# Patient Record
Sex: Female | Born: 1970 | Race: White | Hispanic: No | Marital: Married | Smoking: Never smoker
Health system: Southern US, Community
[De-identification: ages and names within clinical notes are randomized; demographics above are authoritative.]

## PROBLEM LIST (undated history)

## (undated) DIAGNOSIS — F32A Depression, unspecified: Secondary | ICD-10-CM

## (undated) DIAGNOSIS — E559 Vitamin D deficiency, unspecified: Secondary | ICD-10-CM

## (undated) DIAGNOSIS — E039 Hypothyroidism, unspecified: Secondary | ICD-10-CM

## (undated) DIAGNOSIS — F329 Major depressive disorder, single episode, unspecified: Secondary | ICD-10-CM

## (undated) DIAGNOSIS — R609 Edema, unspecified: Secondary | ICD-10-CM

## (undated) DIAGNOSIS — E785 Hyperlipidemia, unspecified: Secondary | ICD-10-CM

## (undated) DIAGNOSIS — F419 Anxiety disorder, unspecified: Secondary | ICD-10-CM

## (undated) DIAGNOSIS — D649 Anemia, unspecified: Secondary | ICD-10-CM

## (undated) DIAGNOSIS — E079 Disorder of thyroid, unspecified: Secondary | ICD-10-CM

## (undated) DIAGNOSIS — G4733 Obstructive sleep apnea (adult) (pediatric): Secondary | ICD-10-CM

## (undated) DIAGNOSIS — Z9989 Dependence on other enabling machines and devices: Secondary | ICD-10-CM

## (undated) HISTORY — DX: Edema, unspecified: R60.9

## (undated) HISTORY — DX: Obstructive sleep apnea (adult) (pediatric): G47.33

## (undated) HISTORY — DX: Major depressive disorder, single episode, unspecified: F32.9

## (undated) HISTORY — DX: Anxiety disorder, unspecified: F41.9

## (undated) HISTORY — DX: Hypothyroidism, unspecified: E03.9

## (undated) HISTORY — DX: Hyperlipidemia, unspecified: E78.5

## (undated) HISTORY — DX: Vitamin D deficiency, unspecified: E55.9

## (undated) HISTORY — DX: Dependence on other enabling machines and devices: Z99.89

## (undated) HISTORY — DX: Anemia, unspecified: D64.9

## (undated) HISTORY — PX: WISDOM TOOTH EXTRACTION: SHX21

## (undated) HISTORY — PX: MOUTH SURGERY: SHX715

## (undated) HISTORY — DX: Disorder of thyroid, unspecified: E07.9

## (undated) HISTORY — DX: Depression, unspecified: F32.A

---

## 1999-05-19 ENCOUNTER — Other Ambulatory Visit: Admission: RE | Admit: 1999-05-19 | Discharge: 1999-05-19 | Payer: Self-pay | Admitting: Obstetrics and Gynecology

## 1999-12-06 ENCOUNTER — Emergency Department (HOSPITAL_COMMUNITY): Admission: EM | Admit: 1999-12-06 | Discharge: 1999-12-06 | Payer: Self-pay

## 2000-06-07 ENCOUNTER — Other Ambulatory Visit: Admission: RE | Admit: 2000-06-07 | Discharge: 2000-06-07 | Payer: Self-pay | Admitting: Obstetrics and Gynecology

## 2000-07-14 ENCOUNTER — Encounter: Payer: Self-pay | Admitting: Obstetrics and Gynecology

## 2000-07-14 ENCOUNTER — Ambulatory Visit (HOSPITAL_COMMUNITY): Admission: RE | Admit: 2000-07-14 | Discharge: 2000-07-14 | Payer: Self-pay | Admitting: Obstetrics and Gynecology

## 2000-07-29 ENCOUNTER — Ambulatory Visit (HOSPITAL_COMMUNITY): Admission: RE | Admit: 2000-07-29 | Discharge: 2000-07-29 | Payer: Self-pay | Admitting: *Deleted

## 2000-07-29 ENCOUNTER — Encounter: Payer: Self-pay | Admitting: *Deleted

## 2000-12-15 ENCOUNTER — Inpatient Hospital Stay (HOSPITAL_COMMUNITY): Admission: AD | Admit: 2000-12-15 | Discharge: 2000-12-15 | Payer: Self-pay | Admitting: Obstetrics and Gynecology

## 2000-12-22 ENCOUNTER — Inpatient Hospital Stay (HOSPITAL_COMMUNITY): Admission: AD | Admit: 2000-12-22 | Discharge: 2000-12-24 | Payer: Self-pay | Admitting: Obstetrics and Gynecology

## 2001-06-15 ENCOUNTER — Other Ambulatory Visit: Admission: RE | Admit: 2001-06-15 | Discharge: 2001-06-15 | Payer: Self-pay | Admitting: Obstetrics and Gynecology

## 2002-06-21 ENCOUNTER — Other Ambulatory Visit: Admission: RE | Admit: 2002-06-21 | Discharge: 2002-06-21 | Payer: Self-pay | Admitting: Obstetrics and Gynecology

## 2003-06-13 ENCOUNTER — Encounter: Payer: Self-pay | Admitting: Obstetrics and Gynecology

## 2003-06-13 ENCOUNTER — Ambulatory Visit (HOSPITAL_COMMUNITY): Admission: RE | Admit: 2003-06-13 | Discharge: 2003-06-13 | Payer: Self-pay | Admitting: Obstetrics and Gynecology

## 2003-09-03 ENCOUNTER — Encounter: Admission: RE | Admit: 2003-09-03 | Discharge: 2003-09-03 | Payer: Self-pay | Admitting: Obstetrics and Gynecology

## 2003-09-17 ENCOUNTER — Ambulatory Visit (HOSPITAL_COMMUNITY): Admission: RE | Admit: 2003-09-17 | Discharge: 2003-09-17 | Payer: Self-pay | Admitting: Obstetrics and Gynecology

## 2003-09-19 ENCOUNTER — Inpatient Hospital Stay (HOSPITAL_COMMUNITY): Admission: AD | Admit: 2003-09-19 | Discharge: 2003-09-21 | Payer: Self-pay | Admitting: Obstetrics and Gynecology

## 2004-04-12 IMAGING — US US OB FOLLOW-UP
1 series · 13 of 28 positions shown · non-contrast
Comparison: none

CLINICAL DATA: 36 week 4 day assigned gestational age by prior ultrasound.  Measuring large for dates.  Gestational diabetes.  Evaluate fetal weight and amniotic fluid.

[Series 1: unknown · 0.30mm/px · 13 of 31 slices shown]
[im 2/31]
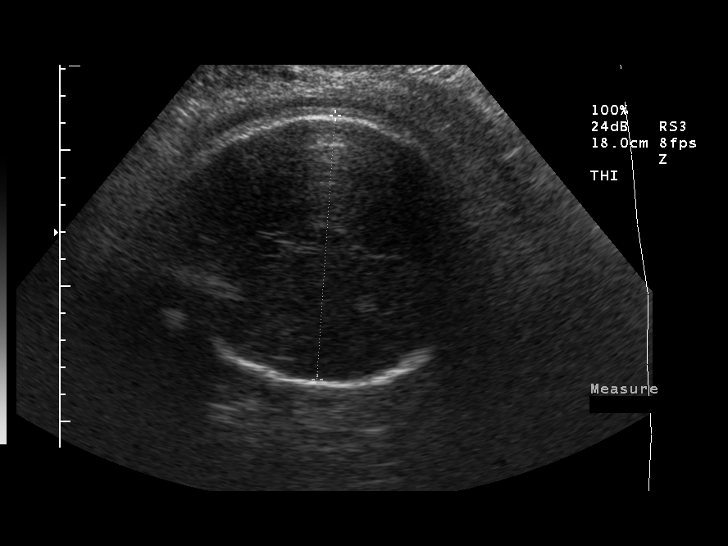
[im 4/31]
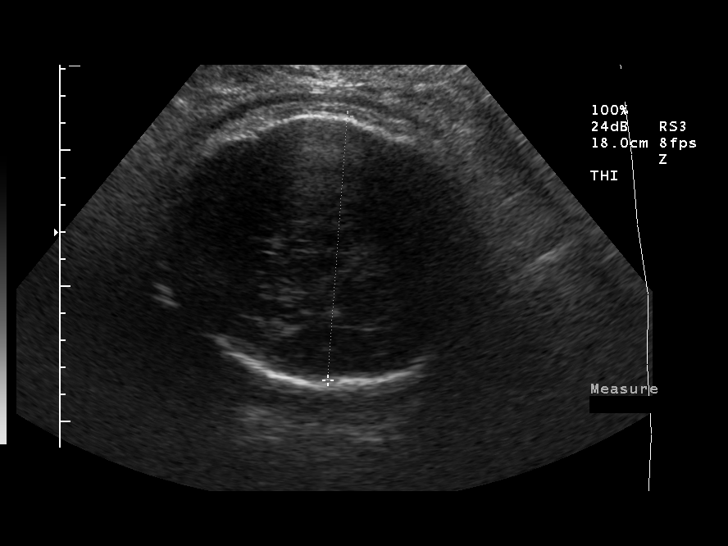
[im 6/31]
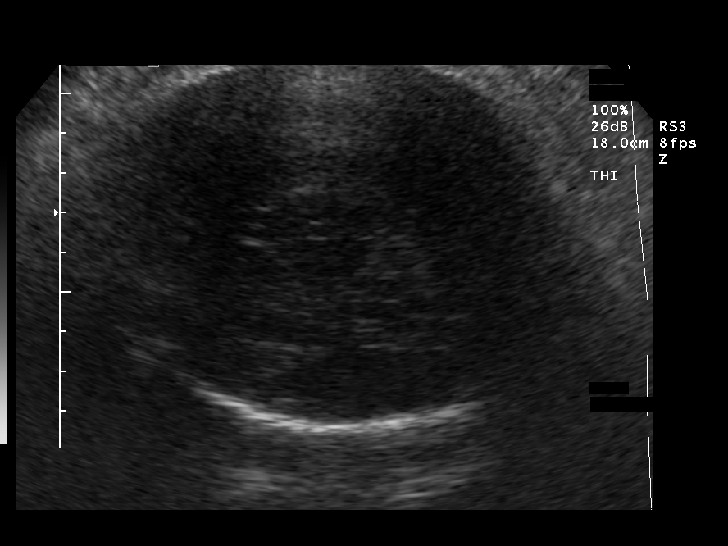
[im 8/31]
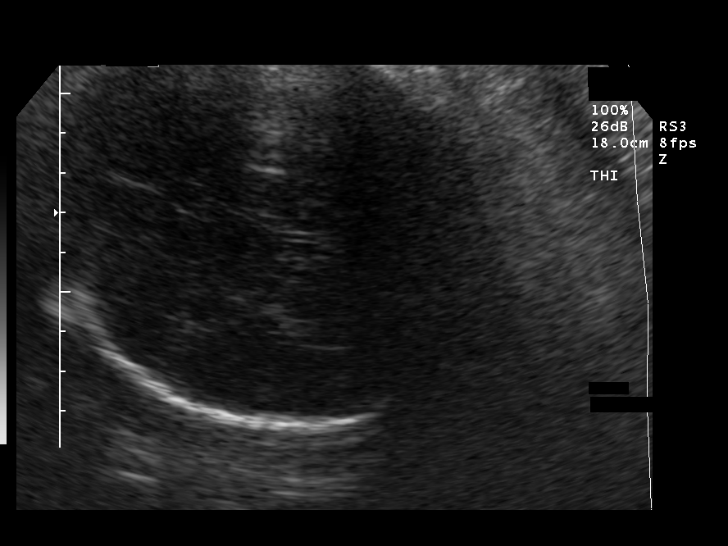
[im 11/31]
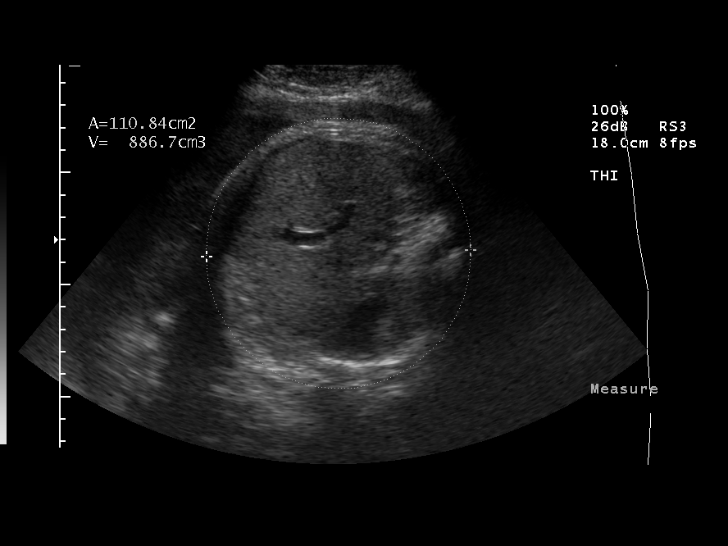
[im 13/31]
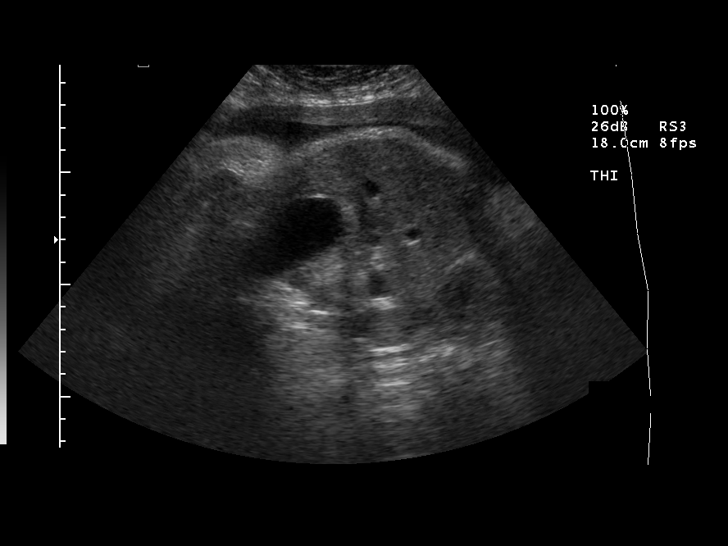
[im 16/31]
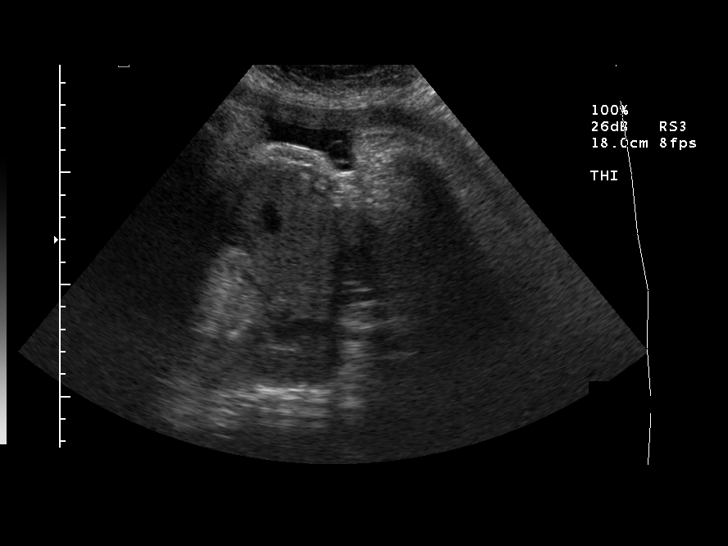
[im 18/31]
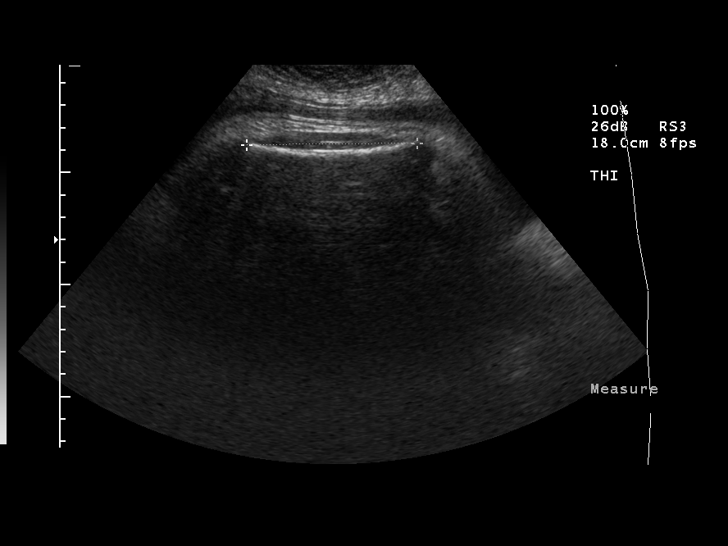
[im 21/31]
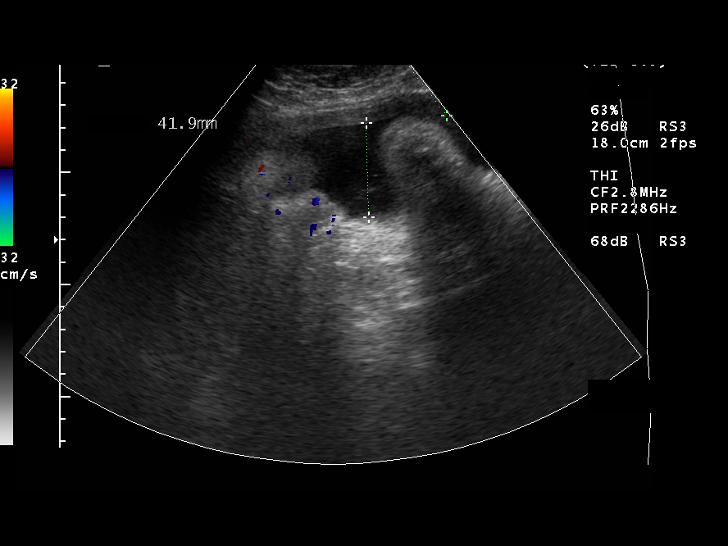
[im 23/31]
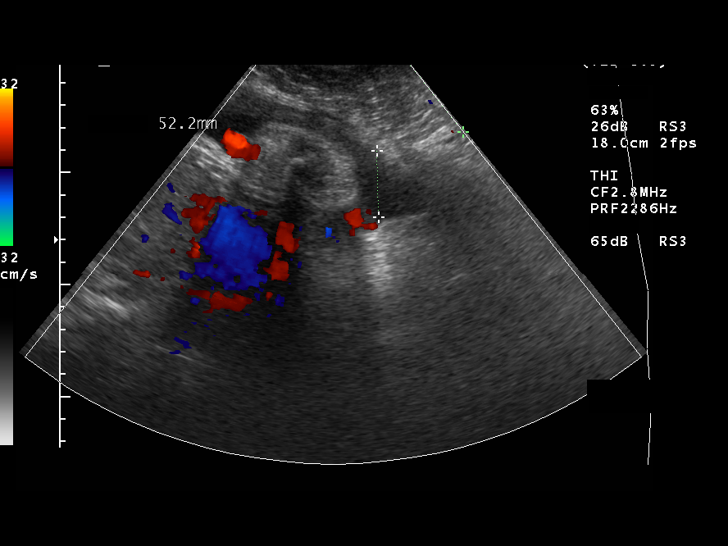
[im 25/31]
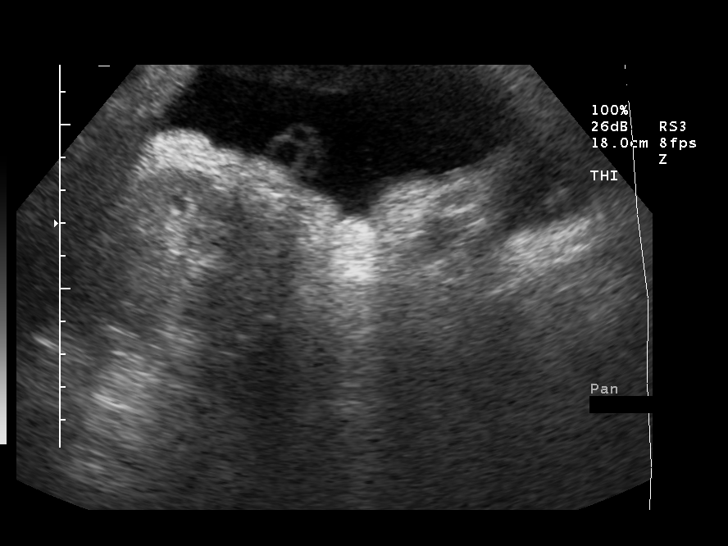
[im 27/31]
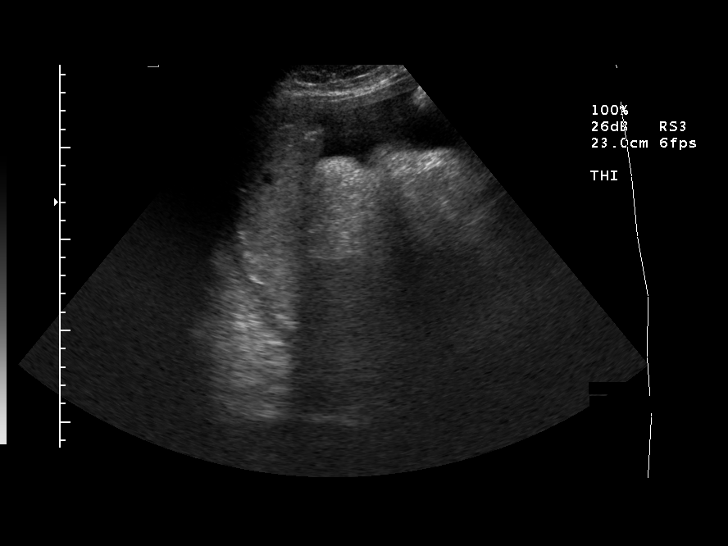
[im 29/31]
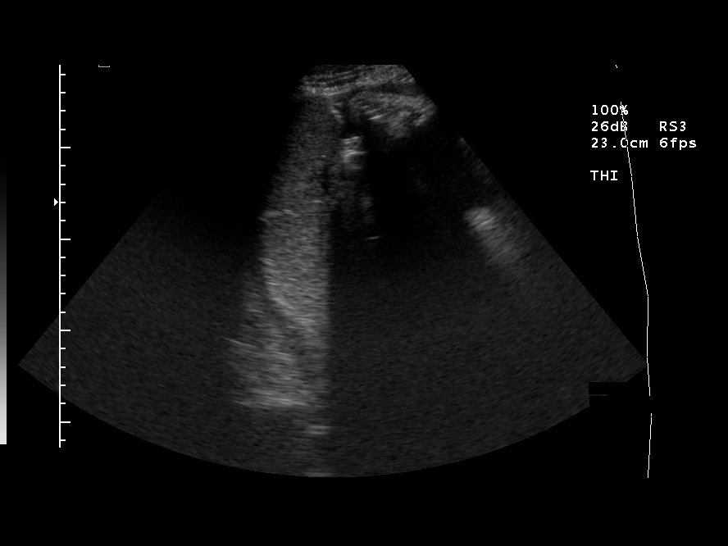

[13 of 28 positions shown; findings below may reference images not displayed]

OBSTETRICAL ULTRASOUND RE-EVALUATION

NUMBER OF FETUSES:  1
HEART RATE:  165
MOVEMENT:  Yes
BREATHING:  No
PRESENTATION:  Cephalic
PLACENTAL LOCATION:  Posterior
GRADE:  II
PREVIA:  No
AMNIOTIC FLUID (subjective):  Normal
AMNIOTIC FLUID (objective):  16.7 cm AFI (5th - 95th%ile =   7.5 ? 24.4 cm for 37 wks)

FETAL BIOMETRY
BPD:  9.8 cm     40 w 0 d
HC:  34.7 cm   40 w 2 d
AC:  37.5 cm   41 w 3 d
FL:  7.5 cm  38 w 1 d
MEAN GA:  40 w 0 d
ASSIGNED GA:  36 w 4 d
EFW:  7984 + / - 605 g (H)       > 95th%ile (95th = 3776 g) for 37 wks

FETAL ANATOMY
LATERAL VENTRICLES:  Visualized 
THALAMI/CSP:  Visualized 
POSTERIOR FOSSA:  Previously seen 
NUCHAL REGION:  Previously seen 
SPINE:  Previously seen 
4 CHAMBER HEART ON LEFT:  Previously seen 
STOMACH ON LEFT:  Visualized 
3 VESSEL CORD:  Visualized 
CORD INSERTION SITE:  Previously seen 
KIDNEYS:  Visualized 
BLADDER:  Visualized 
EXTREMITIES:  Previously seen 

MATERNAL FINDINGS
CERVIX:  Not evaluated (> 37 wks)
IMPRESSION: Assigned gestational age by prior ultrasound is currently 36 weeks 4 days.  Abdominal circumference is approximately 5 weeks ahead, and EFW is greater than 95th percentile and consistent with fetal macrosomia.  
Amniotic fluid volume within normal limits, with AFI of 16.7 cm.

## 2005-03-30 ENCOUNTER — Other Ambulatory Visit: Admission: RE | Admit: 2005-03-30 | Discharge: 2005-03-30 | Payer: Self-pay | Admitting: Obstetrics and Gynecology

## 2011-11-16 ENCOUNTER — Other Ambulatory Visit: Payer: Self-pay | Admitting: Physician Assistant

## 2011-11-16 MED ORDER — SERTRALINE HCL 50 MG PO TABS: 50.0000 mg | ORAL_TABLET | Freq: Every day | ORAL | Status: DC

## 2011-11-16 MED ORDER — LEVOTHYROXINE SODIUM 125 MCG PO TABS: 125.0000 ug | ORAL_TABLET | Freq: Every day | ORAL | Status: DC

## 2011-12-22 ENCOUNTER — Ambulatory Visit (INDEPENDENT_AMBULATORY_CARE_PROVIDER_SITE_OTHER): Payer: BC Managed Care – PPO | Admitting: Family Medicine

## 2011-12-22 VITALS — BP 135/82 | HR 98 | Temp 98.9°F | Resp 16 | Ht 60.38 in | Wt 253.6 lb

## 2011-12-22 DIAGNOSIS — F341 Dysthymic disorder: Secondary | ICD-10-CM

## 2011-12-22 DIAGNOSIS — E039 Hypothyroidism, unspecified: Secondary | ICD-10-CM

## 2011-12-22 DIAGNOSIS — F419 Anxiety disorder, unspecified: Secondary | ICD-10-CM

## 2011-12-22 DIAGNOSIS — F32A Depression, unspecified: Secondary | ICD-10-CM

## 2011-12-22 MED ORDER — SERTRALINE HCL 50 MG PO TABS: 50.0000 mg | ORAL_TABLET | Freq: Every day | ORAL | Status: DC

## 2011-12-22 MED ORDER — LEVOTHYROXINE SODIUM 125 MCG PO TABS: 125.0000 ug | ORAL_TABLET | Freq: Every day | ORAL | Status: DC

## 2011-12-22 NOTE — Progress Notes (Signed)
  [facility]:  Office Visit  Chief Complaint:  Chief Complaint  Patient presents with  . Hypothyroidism     recheck  med refill  . Anxiety    recheck  med refill    HPI: Cassandra Barrera is a 41 y.o. female who complains of for thyroid check . No SEs. Doing well. Compliant with meds. No SI/HI  Past Medical History  Diagnosis Date  . Anxiety   . Depression   . Thyroid disease    History reviewed. No pertinent past surgical history. History   Social History  . Marital Status: Married    Spouse Name: N/A    Number of Children: N/A  . Years of Education: N/A   Social History Main Topics  . Smoking status: Never Smoker   . Smokeless tobacco: None  . Alcohol Use: No  . Drug Use: No  . Sexually Active: None   Other Topics Concern  . None   Social History Narrative  . None   Family History  Problem Relation Age of Onset  . Hypertension Mother   . Diabetes Mother   . Diabetes Father    Allergies  Allergen Reactions  . Sulfa Antibiotics Other (See Comments)    Reaction: lightheaded   Prior to Admission medications   Medication Sig Start Date End Date Taking? Authorizing Provider  levothyroxine (SYNTHROID, LEVOTHROID) 125 MCG tablet Take 1 tablet (125 mcg total) by mouth daily. 11/16/11 11/15/12 Yes [name] M Dunn, [name]  Loratadine (CLARITIN PO) Take by mouth daily.   Yes Historical Provider, MD  sertraline (ZOLOFT) 50 MG tablet Take 1 tablet (50 mg total) by mouth daily. 11/16/11 11/15/12 Yes [name] M Dunn, [name]     ROS: The patient denies fevers, chills, night sweats, unintentional weight loss, chest pain, palpitations, wheezing, dyspnea on exertion, nausea, vomiting, abdominal pain, dysuria, hematuria, melena, numbness, weakness, or tingling.  All other systems have been reviewed and were otherwise negative with the exception of those mentioned in the HPI and as above.    PHYSICAL EXAM: Filed Vitals:   12/22/11 1557  BP: 135/82  Pulse: 98    Temp: 98.9 F (37.2 C)  Resp: 16   Filed Vitals:   12/22/11 1557  Height: 5' 0.38" (1.534 m)  Weight: 253 lb 9.6 oz (115.032 kg)   Body mass index is 48.91 kg/(m^2).  General: Alert, no acute distress HEENT:  Normocephalic, atraumatic, oropharynx patent.  Cardiovascular:  Regular rate and rhythm, no rubs murmurs or gallops.  No Carotid bruits, radial pulse intact. No pedal edema.  Respiratory: Clear to auscultation bilaterally.  No wheezes, rales, or rhonchi.  No cyanosis, no use of accessory musculature GI: No organomegaly, abdomen is soft and non-tender, positive bowel sounds.  No masses. Skin: No rashes. Neurologic: Facial musculature symmetric. Psychiatric: Patient is appropriate throughout our interaction. Lymphatic: No cervical lymphadenopathy. No thyridmegaly Musculoskeletal: Gait intact.   LABS: No results found for this or any previous visit.   EKG/XRAY:   Primary read interpreted by Dr. Conley Rolls at [facility].   ASSESSMENT/PLAN: Encounter Diagnoses  Name Primary?  . Hypothyroidism Yes  . Anxiety and depression    TSH pending Refill Synthorid x 5 RF REfill Zoloft 50 mg daily , #90, 1 RF F/u 6 months    Zahid Carneiro PHUONG, DO 12/22/2011 5:37 PM   ]

## 2011-12-23 LAB — TSH: TSH: 4.081 u[IU]/mL (ref 0.350–4.500)

## 2011-12-24 ENCOUNTER — Telehealth: Payer: Self-pay | Admitting: Family Medicine

## 2011-12-24 NOTE — Telephone Encounter (Signed)
LM labs normal.

## 2012-05-11 ENCOUNTER — Other Ambulatory Visit: Payer: Self-pay | Admitting: Family Medicine

## 2012-06-09 ENCOUNTER — Other Ambulatory Visit: Payer: Self-pay | Admitting: Family Medicine

## 2012-11-06 ENCOUNTER — Other Ambulatory Visit: Payer: Self-pay | Admitting: Physician Assistant

## 2013-01-13 ENCOUNTER — Other Ambulatory Visit: Payer: Self-pay | Admitting: Physician Assistant

## 2013-02-23 ENCOUNTER — Ambulatory Visit: Payer: BC Managed Care – PPO | Admitting: Family Medicine

## 2013-02-25 ENCOUNTER — Other Ambulatory Visit: Payer: Self-pay | Admitting: Family Medicine

## 2013-02-25 ENCOUNTER — Other Ambulatory Visit: Payer: Self-pay | Admitting: Physician Assistant

## 2013-03-06 ENCOUNTER — Ambulatory Visit (INDEPENDENT_AMBULATORY_CARE_PROVIDER_SITE_OTHER): Payer: BC Managed Care – PPO | Admitting: Physician Assistant

## 2013-03-06 ENCOUNTER — Encounter: Payer: Self-pay | Admitting: Physician Assistant

## 2013-03-06 VITALS — BP 118/72 | HR 100 | Temp 98.4°F | Resp 16 | Ht 61.0 in | Wt 271.0 lb

## 2013-03-06 DIAGNOSIS — E039 Hypothyroidism, unspecified: Secondary | ICD-10-CM | POA: Insufficient documentation

## 2013-03-06 DIAGNOSIS — F341 Dysthymic disorder: Secondary | ICD-10-CM

## 2013-03-06 DIAGNOSIS — F32A Depression, unspecified: Secondary | ICD-10-CM | POA: Insufficient documentation

## 2013-03-06 DIAGNOSIS — F419 Anxiety disorder, unspecified: Secondary | ICD-10-CM

## 2013-03-06 DIAGNOSIS — F329 Major depressive disorder, single episode, unspecified: Secondary | ICD-10-CM | POA: Insufficient documentation

## 2013-03-06 LAB — CBC WITH DIFFERENTIAL/PLATELET
Eosinophils Absolute: 0.5 10*3/uL (ref 0.0–0.7)
Eosinophils Relative: 4 % (ref 0–5)
HCT: 41.6 % (ref 36.0–46.0)
Lymphocytes Relative: 20 % (ref 12–46)
Lymphs Abs: 2.1 10*3/uL (ref 0.7–4.0)
MCH: 29.5 pg (ref 26.0–34.0)
MCV: 87.8 fL (ref 78.0–100.0)
Monocytes Absolute: 0.7 10*3/uL (ref 0.1–1.0)
Monocytes Relative: 6 % (ref 3–12)
Platelets: 389 10*3/uL (ref 150–400)
RBC: 4.74 MIL/uL (ref 3.87–5.11)
WBC: 10.6 10*3/uL — ABNORMAL HIGH (ref 4.0–10.5)

## 2013-03-06 LAB — LIPID PANEL
HDL: 35 mg/dL — ABNORMAL LOW (ref >39)
LDL Cholesterol: 57 mg/dL (ref 0–99)
Total CHOL/HDL Ratio: 4.1 Ratio
Triglycerides: 264 mg/dL — ABNORMAL HIGH (ref <150)

## 2013-03-06 LAB — COMPREHENSIVE METABOLIC PANEL
AST: 14 U/L (ref 0–37)
Alkaline Phosphatase: 50 U/L (ref 39–117)
BUN: 10 mg/dL (ref 6–23)
Creat: 0.53 mg/dL (ref 0.50–1.10)
Total Bilirubin: 0.4 mg/dL (ref 0.3–1.2)

## 2013-03-06 LAB — TSH: TSH: 8.85 u[IU]/mL — ABNORMAL HIGH (ref 0.350–4.500)

## 2013-03-06 MED ORDER — LEVOTHYROXINE SODIUM 125 MCG PO TABS: ORAL_TABLET | ORAL | Status: DC

## 2013-03-06 MED ORDER — SERTRALINE HCL 50 MG PO TABS: 50.0000 mg | ORAL_TABLET | Freq: Every day | ORAL | Status: DC

## 2013-03-06 NOTE — Progress Notes (Signed)
  Subjective:    Patient ID: Cassandra Barrera, female    DOB: [birth year], 42 y.o.   MRN: [id number]  HPI  Presents for refill of synthroid and zoloft. States she has been without synthroid for 4-6 weeks and without zoloft for 1 week. Reports feeling more tired than usual and says she can tell that she has been without her medication. Felt like she was well controlled on synthroid and on zoloft without problems prior to running out of medication.   Review of Systems Denies: headache, dizziness, SOB, chest pain, abdominal pain, urinary symptoms.     Objective:   Physical Exam  BP 118/72  Pulse 100  Temp(Src) 98.4 F (36.9 C) (Oral)  Resp 16  Ht 5\' 1"  (1.549 m)  Wt 271 lb (122.925 kg)  BMI 51.23 kg/m2  SpO2 99%  LMP 02/20/2013  General: WDWN female, NAD  Resp: clear to auscultation anterior and posterior fields bilaterally, no rales/rhonchi/wheezes  Cardiac: RRR, without murmurs/rubs/gallops Extremities: moves all limbs spontaneously  Neuro: alert & oriented x 3, normal affect, cranial nerves II-XII grossly intact  Skin: no rashes, lesions      Assessment & Plan:  Hypothyroidism - Plan: levothyroxine (SYNTHROID, LEVOTHROID) 125 MCG tablet, Lipid panel, TSH  Anxiety and depression - Plan: sertraline (ZOLOFT) 50 MG tablet, CBC with Differential, Comprehensive metabolic panel   Refill Synthroid, Zoloft.  Follow up in 6 months or sooner if needed.

## 2013-03-06 NOTE — Progress Notes (Signed)
I have examined this patient along with the student and agree.  

## 2013-03-06 NOTE — Patient Instructions (Signed)
I will contact you with your lab results as soon as they are available.   If you have not heard from me in 2 weeks, please contact me.  The fastest way to get your results is to register for My Chart (see the instructions on the last page of this printout).   

## 2013-07-26 ENCOUNTER — Other Ambulatory Visit: Payer: Self-pay

## 2013-09-19 ENCOUNTER — Other Ambulatory Visit: Payer: Self-pay | Admitting: Physician Assistant

## 2013-09-20 ENCOUNTER — Other Ambulatory Visit: Payer: Self-pay | Admitting: Physician Assistant

## 2013-11-03 ENCOUNTER — Ambulatory Visit (INDEPENDENT_AMBULATORY_CARE_PROVIDER_SITE_OTHER): Payer: BC Managed Care – PPO | Admitting: Family Medicine

## 2013-11-03 VITALS — BP 129/78 | HR 90 | Temp 98.7°F | Resp 18 | Ht 61.0 in | Wt 255.8 lb

## 2013-11-03 DIAGNOSIS — F329 Major depressive disorder, single episode, unspecified: Secondary | ICD-10-CM

## 2013-11-03 DIAGNOSIS — E785 Hyperlipidemia, unspecified: Secondary | ICD-10-CM

## 2013-11-03 DIAGNOSIS — E039 Hypothyroidism, unspecified: Secondary | ICD-10-CM

## 2013-11-03 DIAGNOSIS — F32A Depression, unspecified: Secondary | ICD-10-CM

## 2013-11-03 DIAGNOSIS — F3289 Other specified depressive episodes: Secondary | ICD-10-CM

## 2013-11-03 MED ORDER — SERTRALINE HCL 50 MG PO TABS: ORAL_TABLET | ORAL | Status: DC

## 2013-11-03 MED ORDER — LEVOTHYROXINE SODIUM 125 MCG PO TABS: ORAL_TABLET | ORAL | Status: DC

## 2013-11-03 NOTE — Patient Instructions (Signed)
Please come in for FASTING labs after you have been back on your thyroid medication for about 3 weks.  You can do this as a lab visit only

## 2013-11-03 NOTE — Addendum Note (Signed)
Addended by: Elease EtienneHANSEN, SARA A on: 11/03/2013 04:38 PM   Modules accepted: Orders

## 2013-11-03 NOTE — Progress Notes (Signed)
[facility] [address], [place] [id number] [phone]  Date:  11/03/2013   Name:  Cassandra LarsenStephanie S Barrera   DOB:  04/22/1971   MRN:  [id number]  PCP:  No primary provider on file.    Chief Complaint: Medication Refill   History of Present Illness:  Cassandra LarsenStephanie S Barrera is a 43 y.o. very pleasant female patient who presents with the following:  She is here today to recheck labs and her cholesterol.  She has run out of synthroid for about one week, and her children were ill so she could not get in to be seen until now She is taking zoloft as well- this seems to be helping her with her mood.  No SI. She feels that her mood is ok with her medication She does need to have her cholesterol rechecked but she is not fasting at this time.    Patient Active Problem List   Diagnosis Date Noted  . Hypothyroidism 03/06/2013  . Anxiety and depression 03/06/2013    Past Medical History  Diagnosis Date  . Anxiety   . Depression   . Thyroid disease     History reviewed. No pertinent past surgical history.  History  Substance Use Topics  . Smoking status: Never Smoker   . Smokeless tobacco: Not on file  . Alcohol Use: No    Family History  Problem Relation Age of Onset  . Hypertension Mother   . Diabetes Mother   . Heart disease Mother   . Diabetes Father     Allergies  Allergen Reactions  . Sulfa Antibiotics Other (See Comments)    Reaction: lightheaded    Medication list has been reviewed and updated.  Current Outpatient Prescriptions on File Prior to Visit  Medication Sig Dispense Refill  . levothyroxine (SYNTHROID, LEVOTHROID) 125 MCG tablet Pt needs follow up appt for further refills  30 tablet  0  . Loratadine (CLARITIN PO) Take by mouth daily.      . sertraline (ZOLOFT) 50 MG tablet Pt needs appt for further refills  30 tablet  0   No current facility-administered medications on file prior to visit.    Review of Systems:  As per HPI-  otherwise negative.   Physical Examination: Filed Vitals:   11/03/13 1538  BP: 129/78  Pulse: 110  Temp: 98.7 F (37.1 C)  Resp: 18   Filed Vitals:   11/03/13 1538  Height: 5\' 1"  (1.549 m)  Weight: 255 lb 12.8 oz (116.03 kg)   Body mass index is 48.36 kg/(m^2). Ideal Body Weight: Weight in (lb) to have BMI = 25: 132  GEN: WDWN, NAD, Non-toxic, A & O x 3, obese, looks well HEENT: Atraumatic, Normocephalic. Neck supple. No masses, No LAD.   Ears and Nose: No external deformity. CV: RRR, No M/G/R. No JVD. No thrill. No extra heart sounds. PULM: CTA B, no wheezes, crackles, rhonchi. No retractions. No resp. distress. No accessory muscle use. EXTR: No c/c/e NEURO Normal gait.  PSYCH: Normally interactive. Conversant. Not depressed or anxious appearing.  Calm demeanor.    Assessment and Plan: Unspecified hypothyroidism - Plan: levothyroxine (SYNTHROID, LEVOTHROID) 125 MCG tablet, TSH  Other and unspecified hyperlipidemia - Plan: Lipid panel  Depression - Plan: sertraline (ZOLOFT) 50 MG tablet  Hold off on labs until she has been back on her synthroid for 3 weeks and is fasting.  Will follow-up when I receive her labs  Signed Abbe AmsterdamJessica Copland, MD

## 2014-11-19 ENCOUNTER — Other Ambulatory Visit: Payer: Self-pay | Admitting: Family Medicine

## 2014-11-20 ENCOUNTER — Other Ambulatory Visit: Payer: Self-pay | Admitting: Physician Assistant

## 2015-01-29 ENCOUNTER — Other Ambulatory Visit: Payer: Self-pay | Admitting: Physician Assistant

## 2015-02-06 ENCOUNTER — Other Ambulatory Visit: Payer: Self-pay | Admitting: Physician Assistant

## 2015-02-07 NOTE — Telephone Encounter (Signed)
Patient has an appt on 02/27/2015. Can we refill her thyroid medication?

## 2015-02-08 ENCOUNTER — Other Ambulatory Visit: Payer: Self-pay | Admitting: Physician Assistant

## 2015-02-08 NOTE — Telephone Encounter (Signed)
Rx sent to the pharmacy.

## 2015-02-27 ENCOUNTER — Ambulatory Visit (INDEPENDENT_AMBULATORY_CARE_PROVIDER_SITE_OTHER): Payer: BC Managed Care – PPO | Admitting: Physician Assistant

## 2015-02-27 ENCOUNTER — Encounter: Payer: Self-pay | Admitting: Physician Assistant

## 2015-02-27 VITALS — BP 114/81 | HR 103 | Temp 98.7°F | Resp 16 | Ht 61.0 in | Wt 266.0 lb

## 2015-02-27 DIAGNOSIS — E039 Hypothyroidism, unspecified: Secondary | ICD-10-CM

## 2015-02-27 DIAGNOSIS — Z8371 Family history of colonic polyps: Secondary | ICD-10-CM

## 2015-02-27 DIAGNOSIS — Z83719 Family history of colon polyps, unspecified: Secondary | ICD-10-CM

## 2015-02-27 DIAGNOSIS — F418 Other specified anxiety disorders: Secondary | ICD-10-CM

## 2015-02-27 DIAGNOSIS — Z114 Encounter for screening for human immunodeficiency virus [HIV]: Secondary | ICD-10-CM | POA: Diagnosis not present

## 2015-02-27 DIAGNOSIS — F419 Anxiety disorder, unspecified: Principal | ICD-10-CM

## 2015-02-27 DIAGNOSIS — F32A Depression, unspecified: Secondary | ICD-10-CM

## 2015-02-27 DIAGNOSIS — F329 Major depressive disorder, single episode, unspecified: Secondary | ICD-10-CM

## 2015-02-27 LAB — TSH: TSH: 8.746 u[IU]/mL — AB (ref 0.350–4.500)

## 2015-02-27 MED ORDER — LEVOTHYROXINE SODIUM 125 MCG PO TABS: ORAL_TABLET | ORAL | Status: DC

## 2015-02-27 MED ORDER — SERTRALINE HCL 50 MG PO TABS: ORAL_TABLET | ORAL | Status: DC

## 2015-02-27 NOTE — Patient Instructions (Signed)
I will contact you with your lab results as soon as they are available.   If you have not heard from me in 2 weeks, please contact me.  The fastest way to get your results is to register for My Chart (see the instructions on the last page of this printout).   

## 2015-02-27 NOTE — Progress Notes (Signed)
Patient ID: Cassandra Barrera, female    DOB: 31-Aug-1971, 44 y.o.   MRN: [id number]  PCP: No primary care provider on file.  Subjective:   Chief Complaint  Patient presents with  . Medication Refill    SYNTHROID and ZOLOFT    HPI Presents for evaluation of hypothyroidism and anxiety/depression. She feels good, like the medications are working well and without adverse effects. She has no concerns/complaints today.  GYN performs her breast exams and cervical cancer screenings.   Review of Systems  Constitutional: Negative for activity change, appetite change, fatigue and unexpected weight change.  HENT: Negative for congestion, dental problem, ear pain, hearing loss, mouth sores, postnasal drip, rhinorrhea, sneezing, sore throat, tinnitus and trouble swallowing.   Eyes: Negative for photophobia, pain, redness and visual disturbance.  Respiratory: Negative for cough, chest tightness and shortness of breath.   Cardiovascular: Negative for chest pain, palpitations and leg swelling.  Gastrointestinal: Negative for nausea, vomiting, abdominal pain, diarrhea, constipation and blood in stool.  Endocrine: Negative for cold intolerance and heat intolerance.  Genitourinary: Negative for dysuria, urgency, frequency and hematuria.  Musculoskeletal: Negative for myalgias, arthralgias, gait problem and neck stiffness.  Skin: Negative for rash.  Neurological: Negative for dizziness, speech difficulty, weakness, light-headedness, numbness and headaches.  Hematological: Negative for adenopathy.  Psychiatric/Behavioral: Negative for confusion and sleep disturbance. The patient is not nervous/anxious.        Patient Active Problem List   Diagnosis Date Noted  . Hypothyroidism 03/06/2013  . Anxiety and depression 03/06/2013     Prior to Admission medications   Medication Sig Start Date End Date Taking? Authorizing Provider  levothyroxine (SYNTHROID, LEVOTHROID) 125 MCG tablet TAKE 1 TABLET  BY MOUTH EVERY DAY 02/08/15  Yes Morrell Riddle, [name]  Loratadine (CLARITIN PO) Take by mouth daily.   Yes Historical Provider, MD  sertraline (ZOLOFT) 50 MG tablet 1 po daily 11/03/13  Yes Pearline Cables, MD     Allergies  Allergen Reactions  . Sulfa Antibiotics Other (See Comments)    Reaction: lightheaded       Objective:  Physical Exam  Constitutional: She is oriented to person, place, and time. Vital signs are normal. She appears well-developed and well-nourished. She is active and cooperative. No distress.  BP 114/81 mmHg  Pulse 103  Temp(Src) 98.7 F (37.1 C) (Oral)  Resp 16  Ht  (1.549 m)  Wt 266 lb (120.657 kg)  BMI 50.29 kg/m2  SpO2 97%  HENT:  Head: Normocephalic and atraumatic.  Right Ear: Hearing normal.  Left Ear: Hearing normal.  Eyes: Conjunctivae are normal. No scleral icterus.  Neck: Normal range of motion. Neck supple. No thyromegaly present.  Cardiovascular: Normal rate, regular rhythm and normal heart sounds.   Pulses:      Radial pulses are 2+ on the right side, and 2+ on the left side.  Pulmonary/Chest: Effort normal and breath sounds normal.  Lymphadenopathy:       Head (right side): No tonsillar, no preauricular, no posterior auricular and no occipital adenopathy present.       Head (left side): No tonsillar, no preauricular, no posterior auricular and no occipital adenopathy present.    She has no cervical adenopathy.       Right: No supraclavicular adenopathy present.       Left: No supraclavicular adenopathy present.  Neurological: She is alert and oriented to person, place, and time. No sensory deficit.  Skin: Skin is warm, dry and intact. No  rash noted. No cyanosis or erythema. Nails show no clubbing.  Psychiatric: She has a normal mood and affect.           Assessment & Plan:   1. Anxiety and depression Stable/controlled. Continue current treatment. - sertraline (ZOLOFT) 50 MG tablet; 1 po daily  Dispense: 90 tablet; Refill:  3  2. Hypothyroidism, unspecified hypothyroidism type Clinically euthyroid. Await lab results and adjust dose if needed. - levothyroxine (SYNTHROID, LEVOTHROID) 125 MCG tablet; TAKE 1 TABLET BY MOUTH EVERY DAY  Dispense: 90 tablet; Refill: 3 - TSH  3. Screening for HIV (human immunodeficiency virus) - HIV antibody  4. Family history of colonic polyps Her mother had polyps and reportedly her mother's GI specialist advised that this patient have an early screening colonoscopy. Refer to GI for consultation on that matter. She's encouraged to get records from her mother to take with her if at all possible. - Ambulatory referral to Gastroenterology   Fernande Bras, [name] Physician Assistant-Certified [facility] .

## 2015-02-28 LAB — HIV ANTIBODY (ROUTINE TESTING W REFLEX): HIV 1&2 Ab, 4th Generation: NONREACTIVE

## 2015-03-03 MED ORDER — LEVOTHYROXINE SODIUM 150 MCG PO TABS: ORAL_TABLET | ORAL | Status: DC

## 2015-03-03 NOTE — Addendum Note (Signed)
Addended by: Fernande Bras on: 03/03/2015 10:53 AM   Modules accepted: Orders

## 2016-02-29 ENCOUNTER — Other Ambulatory Visit: Payer: Self-pay | Admitting: Physician Assistant

## 2016-07-19 ENCOUNTER — Ambulatory Visit (INDEPENDENT_AMBULATORY_CARE_PROVIDER_SITE_OTHER): Payer: BC Managed Care – PPO | Admitting: Physician Assistant

## 2016-07-19 VITALS — BP 116/78 | HR 93 | Temp 98.4°F | Resp 18 | Ht 61.0 in | Wt 275.0 lb

## 2016-07-19 DIAGNOSIS — Z23 Encounter for immunization: Secondary | ICD-10-CM | POA: Diagnosis not present

## 2016-07-19 DIAGNOSIS — E039 Hypothyroidism, unspecified: Secondary | ICD-10-CM | POA: Diagnosis not present

## 2016-07-19 DIAGNOSIS — F418 Other specified anxiety disorders: Secondary | ICD-10-CM

## 2016-07-19 DIAGNOSIS — F419 Anxiety disorder, unspecified: Secondary | ICD-10-CM

## 2016-07-19 DIAGNOSIS — F329 Major depressive disorder, single episode, unspecified: Secondary | ICD-10-CM

## 2016-07-19 MED ORDER — LEVOTHYROXINE SODIUM 150 MCG PO TABS: ORAL_TABLET | ORAL | 3 refills | Status: DC

## 2016-07-19 MED ORDER — SERTRALINE HCL 50 MG PO TABS: 50.0000 mg | ORAL_TABLET | Freq: Every day | ORAL | 3 refills | Status: DC

## 2016-07-19 NOTE — Progress Notes (Signed)
Patient ID: Cassandra Barrera, female    DOB: 09/13/1971, 45 y.o.   MRN: [id number]  PCP: No primary care provider on file. Last seen here 02/2015.  Subjective:   Chief Complaint  Patient presents with  . Medication Refill    Synthroid, Zoloft    HPI Presents for medication refills.  Reports that anxiety and depression are well controlled on sertraline 50 mg daily, though she notes that she sometimes feels "numb" emotionally, and wonders if the sertraline dose may be too high, but in general desires to continue on the current dose.  Out of levothyroxine x 10 days and has developed fatigue. Prior to that, she reports tolerating the dose of 150 mcg well. Last TSH 02/2015 8.746. The dose was increased to 150 mcg at that time and she was advised to return for repeat TSH in 6 weeks.     Review of Systems  Constitutional: Negative for chills and fever.  Respiratory: Negative for cough and shortness of breath.   Cardiovascular: Negative for chest pain, palpitations and leg swelling.  Gastrointestinal: Negative for diarrhea, nausea and vomiting.  Endocrine: Negative for polydipsia.  Genitourinary: Negative for dysuria, frequency and urgency.  Musculoskeletal: Negative for myalgias.  Skin: Negative for rash.  Neurological: Negative for dizziness and headaches.      Depression screen [facility] 07/19/2016 02/27/2015  Decreased Interest 0 0  Down, Depressed, Hopeless 0 0  PHQ - 2 Score 0 0     Patient Active Problem List   Diagnosis Date Noted  . Need for prophylactic vaccination and inoculation against influenza 07/19/2016  . Hypothyroidism 03/06/2013  . Anxiety and depression 03/06/2013     Prior to Admission medications   Medication Sig Start Date End Date Taking? Authorizing Provider  levothyroxine (SYNTHROID, LEVOTHROID) 150 MCG tablet TAKE 1 TABLET BY MOUTH EVERY DAY 03/03/15  Yes Matthew Cina, [name]  Loratadine (CLARITIN PO) Take by mouth daily.   Yes Historical Provider,  MD  sertraline (ZOLOFT) 50 MG tablet TAKE 1 TABLET BY MOUTH DAILY 03/02/16  Yes Elnor Renovato, [name]     Allergies  Allergen Reactions  . Sulfa Antibiotics Other (See Comments)    Reaction: lightheaded       Objective:  Physical Exam  Constitutional: She is oriented to person, place, and time. She appears well-developed and well-nourished. She is active and cooperative. No distress.  BP 116/78   Pulse 93   Temp 98.4 F (36.9 C) (Oral)   Resp 18   Ht 5\' 1"  (1.549 m)   Wt 275 lb (124.7 kg)   SpO2 98%   BMI 51.96 kg/m   HENT:  Head: Normocephalic and atraumatic.  Right Ear: Hearing normal.  Left Ear: Hearing normal.  Eyes: Conjunctivae are normal. No scleral icterus.  Neck: Normal range of motion. Neck supple. No thyromegaly present.  Cardiovascular: Normal rate, regular rhythm and normal heart sounds.   Pulses:      Radial pulses are 2+ on the right side, and 2+ on the left side.  Pulmonary/Chest: Effort normal and breath sounds normal.  Lymphadenopathy:       Head (right side): No tonsillar, no preauricular, no posterior auricular and no occipital adenopathy present.       Head (left side): No tonsillar, no preauricular, no posterior auricular and no occipital adenopathy present.    She has no cervical adenopathy.       Right: No supraclavicular adenopathy present.       Left: No supraclavicular adenopathy  present.  Neurological: She is alert and oriented to person, place, and time. No sensory deficit.  Skin: Skin is warm, dry and intact. No rash noted. No cyanosis or erythema. Nails show no clubbing.  Psychiatric: She has a normal mood and affect. Her speech is normal and behavior is normal.           Assessment & Plan:   1. Hypothyroidism, unspecified type She'll return in 6 weeks for repeat TSH, and will adjust the dose based on that result.  - TSH; Future - levothyroxine (SYNTHROID, LEVOTHROID) 150 MCG tablet; TAKE 1 TABLET BY MOUTH EVERY DAY  Dispense: 90  tablet; Refill: 3  2. Anxiety and depression Controlled. We discussed the options of increasing or decreasing the sertraline dose, but elect to continue the current dose. - sertraline (ZOLOFT) 50 MG tablet; Take 1 tablet (50 mg total) by mouth daily.  Dispense: 90 tablet; Refill: 3  3. Need for prophylactic vaccination and inoculation against influenza - Flu Vaccine QUAD 36+ mos IM    Fernande Brashelle S. Alaia Lordi, [name] Physician Assistant-Certified [facility]

## 2016-07-19 NOTE — Patient Instructions (Signed)
     IF you received an x-ray today, you will receive an invoice from [facility]. Please contact [facility] at [phone] with questions or concerns regarding your invoice.   IF you received labwork today, you will receive an invoice from [facility]. Please contact [facility] at [phone] with questions or concerns regarding your invoice.   Our billing staff will not be able to assist you with questions regarding bills from these companies.  You will be contacted with the lab results as soon as they are available. The fastest way to get your results is to activate your My Chart account. Instructions are located on the last page of this paperwork. If you have not heard from us regarding the results in 2 weeks, please contact this office.      

## 2016-07-19 NOTE — Progress Notes (Signed)
Subjective:    Patient ID: Cassandra LarsenStephanie S Lineberry, female    DOB: 04/07/1971, 45 y.o.   MRN: [id number]  HPI: Presents for a refill of Synthroid and Zoloft. Currently takes 50 mg of Zoloft daily, states she is tolerating this dose well and denies side effects. Only states that sometimes she feels emotionally "numb" but denies depressive symptoms, anhedonia, or increased anxiety. States she feels her anxiety and depression are currently well-managed on the medication, denies desire to change dose at this time. Ran out of her Synthroid about 1.5 weeks ago and since then has noticed some increased fatigue but no other symptoms. Currently taking 150 mcg of Synthroid daily, tolerating well. Denies side effects. Last visit here in June 2016 with a TSH of 8.746, thyroid levels have not been checked since then.   Wants to get flu vaccine today.  Review of Systems Pertinent ROS mentioned above in HPI, otherwise negative   Allergies  Allergen Reactions  . Sulfa Antibiotics Other (See Comments)    Reaction: lightheaded   Prior to Admission medications   Medication Sig Start Date End Date Taking? Authorizing Provider  levothyroxine (SYNTHROID, LEVOTHROID) 150 MCG tablet TAKE 1 TABLET BY MOUTH EVERY DAY 07/19/16  Yes [name] Jeffery, [name]  Loratadine (CLARITIN PO) Take by mouth daily.   Yes Historical Provider, MD  sertraline (ZOLOFT) 50 MG tablet Take 1 tablet (50 mg total) by mouth daily. 07/19/16  Yes Porfirio Oarhelle Jeffery, [name]   Patient Active Problem List   Diagnosis Date Noted  . Need for prophylactic vaccination and inoculation against influenza 07/19/2016  . Hypothyroidism 03/06/2013  . Anxiety and depression 03/06/2013       Objective:   Physical Exam  Constitutional: She is oriented to person, place, and time. She appears well-developed and well-nourished.  HENT:  Head: Normocephalic and atraumatic.  Eyes: Conjunctivae are normal. Pupils are equal, round, and reactive to light. Right eye  exhibits no discharge. Left eye exhibits no discharge. No scleral icterus.  Neck: Normal range of motion. Neck supple. No tracheal deviation present. No thyromegaly present.  Cardiovascular: Normal rate, regular rhythm, normal heart sounds and intact distal pulses.  Exam reveals no gallop and no friction rub.   No murmur heard. Pulmonary/Chest: Effort normal and breath sounds normal. No respiratory distress. She has no wheezes. She has no rales.  Lymphadenopathy:    She has no cervical adenopathy.  Neurological: She is alert and oriented to person, place, and time.  Skin: Skin is warm and dry.  Psychiatric: She has a normal mood and affect. Her behavior is normal. Judgment and thought content normal.     [facility] 07/19/2016 02/27/2015  Decreased Interest 0 0  Down, Depressed, Hopeless 0 0  PHQ - 2 Score 0 0         Assessment & Plan:  1. Hypothyroidism, unspecified type Patient has been off Synthroid x 1.5 weeks, will plan to re-evaluate TSH levels in 6 weeks. Hypothyroidism currently well-controlled on medication. Rx Synthroid 150 mcg PO daily. - TSH; Future - levothyroxine (SYNTHROID, LEVOTHROID) 150 MCG tablet; TAKE 1 TABLET BY MOUTH EVERY DAY  Dispense: 90 tablet; Refill: 3  2. Anxiety and depression Anxiety and depression currently well-controlled on medication. Rx Zoloft 50 mg PO daily. - sertraline (ZOLOFT) 50 MG tablet; Take 1 tablet (50 mg total) by mouth daily.  Dispense: 90 tablet; Refill: 3  3. Need for prophylactic vaccination and inoculation against influenza - Flu Vaccine QUAD 36+ mos IM

## 2017-07-28 ENCOUNTER — Encounter: Payer: Self-pay | Admitting: Physician Assistant

## 2017-07-28 ENCOUNTER — Ambulatory Visit: Payer: BC Managed Care – PPO | Admitting: Physician Assistant

## 2017-07-28 VITALS — BP 124/94 | HR 98 | Temp 98.9°F | Resp 18 | Ht 61.0 in | Wt 296.4 lb

## 2017-07-28 DIAGNOSIS — E039 Hypothyroidism, unspecified: Secondary | ICD-10-CM | POA: Diagnosis not present

## 2017-07-28 DIAGNOSIS — R635 Abnormal weight gain: Secondary | ICD-10-CM

## 2017-07-28 DIAGNOSIS — G4730 Sleep apnea, unspecified: Secondary | ICD-10-CM | POA: Diagnosis not present

## 2017-07-28 NOTE — Progress Notes (Signed)
Patient ID: Cassandra Barrera, female    DOB: 04/17/1971, 46 y.o.   MRN: [id number]  PCP: Cassandra Barrera, Cassandra Aure, [name]  Chief Complaint  Patient presents with  . Sleep issues    x2 months, pt states she is having trouble with nodding off during the day. Pt states she never feels rested and her husband states that he thinks pt stops breathing at night. Pt states she jerks awake at night.    Subjective:   Presents for evaluation of daytime somnolence x 2 months.  Tired all the time, sleepy. If I sit still for 5-15 minutes, I will start to nod off. 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  She noticed symptoms for a couple of months. Her husband has been reporting increased snoring, pauses in breathing, concern for OSA for 1-2 years. Her father has OSA.  Taking her medications daily, no missed doses of loratadine, levothyroxine or sertraline. Last lab draw was 02/2015.  Feels chronically congested in the nose/sinues. Ears feel full. No CP, SOB, HA, dizziness.  Review of Systems As above. No CP, SOB, HA, dizziness. No GU/GI symptoms. No edema.    Patient Active Problem List   Diagnosis Date Noted  . Need for prophylactic vaccination and inoculation against influenza 07/19/2016  . Hypothyroidism 03/06/2013  . Anxiety and depression 03/06/2013     Prior to Admission medications   Medication Sig Start Date End Date Taking? Authorizing Provider  levothyroxine (SYNTHROID, LEVOTHROID) 150 MCG tablet TAKE 1 TABLET BY MOUTH EVERY DAY 07/19/16  Yes Cassandra Loomer, [name]  Loratadine (CLARITIN PO) Take by mouth daily.   Yes [provider]  sertraline (ZOLOFT) 50 MG tablet Take 1 tablet (50 mg total) by mouth daily. 07/19/16  Yes Cassandra Brookover, [name]     Allergies  Allergen Reactions  . Sulfa Antibiotics Other (See Comments)    Reaction: lightheaded       Objective:  Physical Exam  Constitutional: She is oriented to person, place, and time. She  appears well-developed and well-nourished. She is active and cooperative. No distress.  BP (!) 124/94 (BP Location: Left Arm, Patient Position: Sitting, Cuff Size: Large)   Pulse 98   Temp 98.9 F (37.2 C) (Oral)   Resp 18   Ht 5\' 1"  (1.549 m)   Wt 296 lb 6.4 oz (134.4 kg)   SpO2 97%   BMI 56.00 kg/m   HENT:  Head: Normocephalic and atraumatic.  Right Ear: Hearing normal.  Left Ear: Hearing normal.  Eyes: Conjunctivae are normal. No scleral icterus.  Neck: Normal range of motion. Neck supple. No thyromegaly present.  Cardiovascular: Normal rate, regular rhythm and normal heart sounds.  Pulses:      Radial pulses are 2+ on the right side, and 2+ on the left side.  Pulmonary/Chest: Effort normal and breath sounds normal.  Lymphadenopathy:       Head (right side): No tonsillar, no preauricular, no posterior auricular and no occipital adenopathy present.       Head (left side): No tonsillar, no preauricular, no posterior auricular and no occipital adenopathy present.    She has no cervical adenopathy.       Right: No supraclavicular adenopathy present.       Left: No supraclavicular adenopathy present.  Neurological: She is alert and oriented to person, place, and time. No sensory deficit.  Skin: Skin is warm, dry and intact. No rash noted. No cyanosis or erythema. Nails show no clubbing.  Psychiatric: She has a normal mood and affect. Her speech is normal and behavior is normal.       Wt Readings from Last 3 Encounters:  07/28/17 296 lb 6.4 oz (134.4 kg)  07/19/16 275 lb (124.7 kg)  02/27/15 266 lb (120.7 kg)       Assessment & Plan:   Problem List Items Addressed This Visit    Hypothyroidism    Has been controlled. Due to current symptoms, update lab.      Relevant Orders   TSH (Completed)   T4, free (Completed)   Sleep apnea - Primary    OSA by history. Refer to sleep medicine for additional evaluation and treatment.      Relevant Orders   CBC with  Differential/Platelet (Completed)   Ambulatory referral to Sleep Studies    Other Visit Diagnoses    Weight gain       await lab results.   Relevant Orders   Comprehensive metabolic panel (Completed)       Return in about 6 months (around 01/25/2018) for re-evaluation of thyroid function.   Cassandra Barrera. Chau Savell, [name] [facility]

## 2017-07-28 NOTE — Patient Instructions (Signed)
     IF you received an x-ray today, you will receive an invoice from [facility]. Please contact [facility] at [phone] with questions or concerns regarding your invoice.   IF you received labwork today, you will receive an invoice from [facility]. Please contact [facility] at [phone] with questions or concerns regarding your invoice.   Our billing staff will not be able to assist you with questions regarding bills from these companies.  You will be contacted with the lab results as soon as they are available. The fastest way to get your results is to activate your My Chart account. Instructions are located on the last page of this paperwork. If you have not heard from us regarding the results in 2 weeks, please contact this office.     

## 2017-07-29 LAB — CBC WITH DIFFERENTIAL/PLATELET
BASOS ABS: 0 10*3/uL (ref 0.0–0.2)
Basos: 0 %
EOS (ABSOLUTE): 0.5 10*3/uL — ABNORMAL HIGH (ref 0.0–0.4)
Eos: 4 %
HEMOGLOBIN: 13.8 g/dL (ref 11.1–15.9)
Hematocrit: 41.9 % (ref 34.0–46.6)
IMMATURE GRANS (ABS): 0 10*3/uL (ref 0.0–0.1)
Immature Granulocytes: 0 %
LYMPHS: 20 %
Lymphocytes Absolute: 2.5 10*3/uL (ref 0.7–3.1)
MCH: 29 pg (ref 26.6–33.0)
MCHC: 32.9 g/dL (ref 31.5–35.7)
MCV: 88 fL (ref 79–97)
MONOCYTES: 6 %
Monocytes Absolute: 0.7 10*3/uL (ref 0.1–0.9)
NEUTROS PCT: 70 %
Neutrophils Absolute: 8.7 10*3/uL — ABNORMAL HIGH (ref 1.4–7.0)
PLATELETS: 406 10*3/uL — AB (ref 150–379)
RBC: 4.76 x10E6/uL (ref 3.77–5.28)
RDW: 14.1 % (ref 12.3–15.4)
WBC: 12.5 10*3/uL — ABNORMAL HIGH (ref 3.4–10.8)

## 2017-07-29 LAB — COMPREHENSIVE METABOLIC PANEL
ALBUMIN: 4.2 g/dL (ref 3.5–5.5)
ALT: 13 IU/L (ref 0–32)
AST: 16 IU/L (ref 0–40)
Albumin/Globulin Ratio: 1.4 (ref 1.2–2.2)
Alkaline Phosphatase: 62 IU/L (ref 39–117)
BUN/Creatinine Ratio: 21 (ref 9–23)
BUN: 14 mg/dL (ref 6–24)
Bilirubin Total: 0.2 mg/dL (ref 0.0–1.2)
CO2: 25 mmol/L (ref 20–29)
Calcium: 9.4 mg/dL (ref 8.7–10.2)
Chloride: 100 mmol/L (ref 96–106)
Creatinine, Ser: 0.66 mg/dL (ref 0.57–1.00)
GFR calc non Af Amer: 108 mL/min/{1.73_m2} (ref >59)
GFR, EST AFRICAN AMERICAN: 124 mL/min/{1.73_m2} (ref >59)
GLUCOSE: 76 mg/dL (ref 65–99)
Globulin, Total: 2.9 g/dL (ref 1.5–4.5)
Potassium: 4.5 mmol/L (ref 3.5–5.2)
Sodium: 140 mmol/L (ref 134–144)
TOTAL PROTEIN: 7.1 g/dL (ref 6.0–8.5)

## 2017-07-29 LAB — T4, FREE: Free T4: 1.06 ng/dL (ref 0.82–1.77)

## 2017-07-29 LAB — TSH: TSH: 13.3 u[IU]/mL — AB (ref 0.450–4.500)

## 2017-08-04 ENCOUNTER — Encounter: Payer: Self-pay | Admitting: Neurology

## 2017-08-04 ENCOUNTER — Ambulatory Visit: Payer: BC Managed Care – PPO | Admitting: Neurology

## 2017-08-04 VITALS — BP 140/80 | HR 88 | Ht 61.0 in | Wt 298.0 lb

## 2017-08-04 DIAGNOSIS — R351 Nocturia: Secondary | ICD-10-CM | POA: Diagnosis not present

## 2017-08-04 DIAGNOSIS — R400 Somnolence: Secondary | ICD-10-CM

## 2017-08-04 DIAGNOSIS — R0683 Snoring: Secondary | ICD-10-CM

## 2017-08-04 DIAGNOSIS — Z6841 Body Mass Index (BMI) 40.0 and over, adult: Secondary | ICD-10-CM | POA: Diagnosis not present

## 2017-08-04 DIAGNOSIS — R0681 Apnea, not elsewhere classified: Secondary | ICD-10-CM

## 2017-08-04 NOTE — Progress Notes (Signed)
Subjective:    Patient ID: Cassandra LarsenStephanie S Barrera is a 46 y.o. female.  HPI     Cassandra FoleySaima Shreya Lacasse, MD, PhD [facility] [address], [address] [address] [place], [place] [address]   Dear Avelino Leedshelle,   I saw your patient, Cassandra HoughStephanie Barrera, upon your kind request in my neurologic clinic today for initial consultation of her sleep disorder, in particular, concern for underlying obstructive sleep apnea. The patient is unaccompanied today. As you know, Cassandra Barrera is a 46 year old right-handed woman with an underlying medical history of anxiety, depression, hypothyroidism, allergies and morbid obesity with a BMI of over 50, who reports snoring and excessive daytime somnolence as well as a family history of OSA. Her husband has witnessed apneas while she is asleep. I reviewed your office note from 07/28/2017. Her Epworth sleepiness score is 20 out of 24, fatigue score is 32 out of 63. She does not wake up rested. She wakes up with a sense of gasping for air. She lives with her husband and children. She has 2 daughters, ages 4213 and 8416. She works for [employer], part-time as a Comptrollerlibrarian, she also tutors math. She is a nonsmoker and does not utilize alcohol and drinks caffeine in the form of soda, 2-3 per day.  She is usually in bed by 10 PM , she does not watch TV in bed. They do have a dog in the bedroom and sometimes on her bed. She has a wake time of around 7. She denies any telltale symptoms of restless leg syndrome or leg twitching at night. She has nocturia about once per night on average. She has come close to dozing off at work. She does not take any scheduled naps but has lately fallen asleep inadvertently when sedentary at home. She has gained some weight in the past few years. She has gradually become more sleepy during the day. Her father has obstructive sleep apnea and uses a CPAP machine.  Her Past Medical History Is Significant For: Past Medical History:  Diagnosis Date  .  Anxiety   . Depression   . Thyroid disease    underactive    Her Past Surgical History Is Significant For: No past surgical history on file.  Her Family History Is Significant For: Family History  Problem Relation Age of Onset  . Hypertension Mother   . Diabetes Mother   . Heart disease Mother   . Diabetes Father     Her Social History Is Significant For: Social History   Socioeconomic History  . Marital status: Married    Spouse name: Elita QuickJose "Francis DowseJoel"  . Number of children: 2  . Years of education: college  . Highest education level: None  Social Needs  . Financial resource strain: None  . Food insecurity - worry: None  . Food insecurity - inability: None  . Transportation needs - medical: None  . Transportation needs - non-medical: None  Occupational History  . Occupation: part-time Designer, multimediamath tutor    Comment: [employer]  Tobacco Use  . Smoking status: Never Smoker  . Smokeless tobacco: Never Used  Substance and Sexual Activity  . Alcohol use: No    Alcohol/week: 0.0 oz  . Drug use: No  . Sexual activity: Yes    Birth control/protection: IUD  Other Topics Concern  . None  Social History Narrative   Lives with her husband and their 2 daughters.   [place]s live in [place].   Her sister and her husband's family  live in [place].    Her Allergies Are:  Allergies  Allergen Reactions  . Sulfa Antibiotics Other (See Comments)    Reaction: lightheaded  :   Her Current Medications Are:  Outpatient Encounter Medications as of 08/04/2017  Medication Sig  . levothyroxine (SYNTHROID, LEVOTHROID) 150 MCG tablet TAKE 1 TABLET BY MOUTH EVERY DAY  . Loratadine (CLARITIN PO) Take by mouth daily.  . sertraline (ZOLOFT) 50 MG tablet Take 1 tablet (50 mg total) by mouth daily.   No facility-administered encounter medications on file as of 08/04/2017.   :  Review of Systems:  Out of a complete 14 point review of systems, all are reviewed and negative with the  exception of these symptoms as listed below: Review of Systems  Neurological:       Pt presents today to discuss her sleep. Pt has never had a sleep study but does endorse snoring.  Epworth Sleepiness Scale 0= would never doze 1= slight chance of dozing 2= moderate chance of dozing 3= high chance of dozing  Sitting and reading: 3 Watching TV: 3 Sitting inactive in a public place (ex. Theater or meeting): 3 As a passenger in a car for an hour without a break: 3 Lying down to rest in the afternoon: 3 Sitting and talking to someone: 2 Sitting quietly after lunch (no alcohol): 3 In a car, while stopped in traffic: 0 Total: 20     Objective:  Neurological Exam  Physical Exam Physical Examination:   Vitals:   08/04/17 1055  BP: 140/80  Pulse: 88    General Examination: The patient is a very pleasant 46 y.o. female in no acute distress. She appears well-developed and well-nourished and well groomed.   HEENT: Normocephalic, atraumatic, pupils are equal, round and reactive to light and accommodation. Funduscopic exam is normal with sharp disc margins noted. Extraocular tracking is good without limitation to gaze excursion or nystagmus noted. Normal smooth pursuit is noted. Hearing is grossly intact. Tympanic membranes are clear bilaterally. Face is symmetric with normal facial animation and normal facial sensation. Speech is clear with no dysarthria noted. There is no hypophonia. There is no lip, neck/head, jaw or voice tremor. Neck is supple with full range of passive and active motion. There are no carotid bruits on auscultation. Oropharynx exam reveals: mild mouth dryness, good dental hygiene and moderate airway crowding, due to smaller airway entry, tonsils of 1-2+ bilaterally and larger appearing uvula. Mallampati is class III. She has a small mouth opening as well. Neck circumference is 18 inches. She has a minimal to absent overbite.  Chest: Clear to auscultation without  wheezing, rhonchi or crackles noted.  Heart: S1+S2+0, regular and normal without murmurs, rubs or gallops noted.   Abdomen: Soft, non-tender and non-distended with normal bowel sounds appreciated on auscultation.  Extremities: There is no pitting edema in the distal lower extremities bilaterally. Pedal pulses are intact.  Skin: Warm and dry without trophic changes noted.  Musculoskeletal: exam reveals no obvious joint deformities, tenderness or joint swelling or erythema.   Neurologically:  Mental status: The patient is awake, alert and oriented in all 4 spheres. Her immediate and remote memory, attention, language skills and fund of knowledge are appropriate. There is no evidence of aphasia, agnosia, apraxia or anomia. Speech is clear with normal prosody and enunciation. Thought process is linear. Mood is normal and affect is normal.  Cranial nerves II - XII are as described above under HEENT exam. In addition: shoulder shrug is normal  with equal shoulder height noted. Motor exam: Normal bulk, strength and tone is noted. There is no drift, tremor or rebound. Romberg is negative. Reflexes are 2+ throughout. Fine motor skills and coordination: intact with normal finger taps, normal hand movements, normal rapid alternating patting, normal foot taps and normal foot agility.  Cerebellar testing: No dysmetria or intention tremor on finger to nose testing. Heel to shin is unremarkable bilaterally. There is no truncal or gait ataxia.  Sensory exam: intact to light touch in the upper and lower extremities.  Gait, station and balance: She stands easily. No veering to one side is noted. No leaning to one side is noted. Posture is age-appropriate and stance is narrow based. Gait shows normal stride length and normal pace. No problems turning are noted. Tandem walk is unremarkable.   Assessment and Plan:  In summary, MERLEAN PIZZINI is a very pleasant 46 y.o.-year old female with an underlying medical  history of anxiety, depression, hypothyroidism, allergies and morbid obesity with a BMI of over 50, whose history and physical exam are concerning for obstructive sleep apnea (OSA). I had a long chat with the patient about my findings and the diagnosis of OSA, its prognosis and treatment options. We talked about medical treatments, surgical interventions and non-pharmacological approaches. I explained in particular the risks and ramifications of untreated moderate to severe OSA, especially with respect to developing cardiovascular disease down the Road, including congestive heart failure, difficult to treat hypertension, cardiac arrhythmias, or stroke. Even type 2 diabetes has, in part, been linked to untreated OSA. Symptoms of untreated OSA include daytime sleepiness, memory problems, mood irritability and mood disorder such as depression and anxiety, lack of energy, as well as recurrent headaches, especially morning headaches. We talked about smoking cessation and trying to maintain a healthy lifestyle in general, as well as the importance of weight control. I encouraged the patient to eat healthy, exercise daily and keep well hydrated, to keep a scheduled bedtime and wake time routine, to not skip any meals and eat healthy snacks in between meals. I advised the patient not to drive when feeling sleepy. I recommended the following at this time: sleep study with potential positive airway pressure titration. (We will score hypopneas at 3%).   I explained the sleep test procedure to the patient and also outlined possible surgical and non-surgical treatment options of OSA, including the use of a custom-made dental device (which would require a referral to a specialist dentist or oral surgeon), upper airway surgical options, such as pillar implants, radiofrequency surgery, tongue base surgery, and UPPP (which would involve a referral to an ENT surgeon). Rarely, jaw surgery such as mandibular advancement may be  considered.  I also explained the CPAP treatment option to the patient, who indicated that she would be willing to try CPAP if the need arises. I explained the importance of being compliant with PAP treatment, not only for insurance purposes but primarily to improve Her symptoms, and for the patient's long term health benefit, including to reduce Her cardiovascular risks. I answered all her questions today and the patient was in agreement. I would like to see her back after the sleep study is completed and encouraged her to call with any interim questions, concerns, problems or updates.   Thank you very much for allowing me to participate in the care of this nice patient. If I can be of any further assistance to you please do not hesitate to call me at [phone].  Sincerely,  Star Age, MD, PhD

## 2017-08-04 NOTE — Patient Instructions (Addendum)

## 2017-08-07 NOTE — Assessment & Plan Note (Signed)
Has been controlled. Due to current symptoms, update lab.

## 2017-08-07 NOTE — Assessment & Plan Note (Signed)
OSA by history. Refer to sleep medicine for additional evaluation and treatment.

## 2017-08-29 ENCOUNTER — Other Ambulatory Visit: Payer: Self-pay | Admitting: Physician Assistant

## 2017-08-29 DIAGNOSIS — E039 Hypothyroidism, unspecified: Secondary | ICD-10-CM

## 2017-08-29 MED ORDER — LEVOTHYROXINE SODIUM 175 MCG PO TABS: 175.0000 ug | ORAL_TABLET | Freq: Every day | ORAL | 3 refills | Status: DC

## 2017-09-26 ENCOUNTER — Ambulatory Visit (INDEPENDENT_AMBULATORY_CARE_PROVIDER_SITE_OTHER): Payer: BC Managed Care – PPO | Admitting: Neurology

## 2017-09-26 DIAGNOSIS — Z6841 Body Mass Index (BMI) 40.0 and over, adult: Secondary | ICD-10-CM

## 2017-09-26 DIAGNOSIS — R351 Nocturia: Secondary | ICD-10-CM

## 2017-09-26 DIAGNOSIS — R0681 Apnea, not elsewhere classified: Secondary | ICD-10-CM

## 2017-09-26 DIAGNOSIS — R400 Somnolence: Secondary | ICD-10-CM

## 2017-09-26 DIAGNOSIS — G4733 Obstructive sleep apnea (adult) (pediatric): Secondary | ICD-10-CM

## 2017-09-26 DIAGNOSIS — R0683 Snoring: Secondary | ICD-10-CM

## 2017-09-27 ENCOUNTER — Other Ambulatory Visit: Payer: Self-pay | Admitting: Neurology

## 2017-09-27 ENCOUNTER — Telehealth: Payer: Self-pay

## 2017-09-27 DIAGNOSIS — G4733 Obstructive sleep apnea (adult) (pediatric): Secondary | ICD-10-CM

## 2017-09-27 NOTE — Telephone Encounter (Signed)
I called pt to discuss her sleep study results. No answer, left a message asking her to call me back. 

## 2017-09-27 NOTE — Procedures (Signed)
PATIENT'Barrera NAME:  Cassandra Barrera, Cassandra Barrera DOB:      07/17/1971      MR#:    [id number]     DATE OF RECORDING: 09/26/2017 REFERRING M.D.:  Theora GianottiJeffrey [name], [name] Study Performed:  Split-Night Titration Study HISTORY: 47 year old woman with a history of anxiety, depression, hypothyroidism, allergies and morbid obesity with a BMI of over 50, who reports snoring and excessive daytime somnolence as well as a family history of OSA. The patient endorsed the Epworth Sleepiness Scale at 20/24 points. The patient'Barrera weight 298 pounds with a height of 61 (inches), resulting in a BMI of 56.2 kg/m2. The patient'Barrera neck circumference measured 19 inches.  CURRENT MEDICATIONS: Levothyroxine, Loratadine and Sertraline.  PROCEDURE:  This is a multichannel digital polysomnogram utilizing the Somnostar 11.2 system.  Electrodes and sensors were applied and monitored per AASM Specifications.   EEG, EOG, Chin and Limb EMG, were sampled at 200 Hz.  ECG, Snore and Nasal Pressure, Thermal Airflow, Respiratory Effort, CPAP Flow and Pressure, Oximetry was sampled at 50 Hz. Digital video and audio were recorded.      BASELINE STUDY WITHOUT CPAP RESULTS:  Lights Out was at 22:31 and Lights On at 05:38 for the night, split study started at epoch 301, 00:44 AM. Total recording time (TRT) was 132.5, with a total sleep time (TST) of 108.5 minutes.   The patient'Barrera sleep latency was 22 minutes.  REM sleep was absent. The sleep efficiency was 81.9 %.    SLEEP ARCHITECTURE: WASO (Wake after sleep onset) was 0 minutes, Stage N1 was 2 minutes, Stage N2 was 106.5 minutes, Stage N3 was 0 minutes and Stage R (REM sleep) was 0 minutes.  The percentages were Stage N1 1.8%, Stage N2 98.2%, Stage N3 and Stage R (REM sleep) were absent. The arousals were noted as: 2 were spontaneous, 0 were associated with PLMs, 298 were associated with respiratory events.  Audio and video analysis did not show any abnormal or unusual movements, behaviors, phonations or  vocalizations. The patient took no bathroom breaks. Moderate to loud snoring was noted. The EKG was in keeping with normal sinus rhythm (NSR).  RESPIRATORY ANALYSIS:  There were a total of 299 respiratory events:  106 obstructive apneas, 0 central apneas and 0 mixed apneas with a total of 106 apneas and an apnea index (AI) of 58.6. There were 193 hypopneas with a hypopnea index of 106.7. The patient also had 0 respiratory event related arousals (RERAs).  Snoring was noted.     The total APNEA/HYPOPNEA INDEX (AHI) was 165.3 /hour and the total RESPIRATORY DISTURBANCE INDEX was 165.3 /hour.  0 events occurred in REM sleep and 386 events in NREM. The REM AHI was n/a, /hour versus a non-REM AHI of 165.3 /hour. The patient spent 33 minutes sleep time in the supine position 349 minutes in non-supine. The supine AHI was n/a versus a non-supine AHI of 165.3 /hour.  OXYGEN SATURATION & C02:  The wake baseline 02 saturation was 96%, with the lowest being 66%. Time spent below 89% saturation equaled 103 minutes.  PERIODIC LIMB MOVEMENTS: The patient had a total of 0 Periodic Limb Movements.  The Periodic Limb Movement (PLM) index was 0 /hour and the PLM Arousal index was 0 /hour.  TITRATION STUDY WITH CPAP RESULTS:   The patient was fitted with small nasal pillows. CPAP was initiated at 5 cmH20 with heated humidity per AASM split night standards and pressure was advanced to 12 cmH20 because of hypopneas, apneas and desaturations.  At a PAP pressure of 12 cmH20, there was a reduction of the AHI to 0/hour with non-supine REM sleep achieved and O2 nadir of 90%.   Total recording time (TRT) was 295 minutes, with a total sleep time (TST) of 273 minutes. The patient'Barrera sleep latency was 21.5 minutes. REM latency was 30 minutes.  The sleep efficiency was 92.5%.    SLEEP ARCHITECTURE: Wake after sleep was 5.5 minutes, Stage N1 6.5 minutes, Stage N2 83.5 minutes, Stage N3 11.5 minutes and Stage R (REM sleep) 171.5  minutes. The percentages were: Stage N1 2.4%, Stage N2 30.6%, Stage N3 4.2% and Stage R (REM sleep) 62.8%, which is markedly increased and in keeping with rebound. The arousals were noted as: 11 were spontaneous, 0 were associated with PLMs, 57 were associated with respiratory events.  RESPIRATORY ANALYSIS:  There were a total of 58 respiratory events: 0 obstructive apneas, 0 central apneas and 0 mixed apneas with a total of 0 apneas and an apnea index (AI) of 0. There were 58 hypopneas with a hypopnea index of 12.7 /hour. The patient also had 0 respiratory event related arousals (RERAs).      The total APNEA/HYPOPNEA INDEX  (AHI) was 12.7 /hour and the total RESPIRATORY DISTURBANCE INDEX was 12.7 /hour.  15 events occurred in REM sleep and 43 events in NREM. The REM AHI was 5.2 /hour versus a non-REM AHI of 25.4 /hour. REM sleep was achieved on a pressure of  cm/h2o (AHI was  .) The patient spent 12% of total sleep time in the supine position. The supine AHI was 9.1 /hour, versus a non-supine AHI of 13.3/hour.  OXYGEN SATURATION & C02:  The wake baseline 02 saturation was 97%, with the lowest being 66%. Time spent below 89% saturation equaled 79 minutes.  PERIODIC LIMB MOVEMENTS: The patient had a total of 0 Periodic Limb Movements. The Periodic Limb Movement (PLM) index was 0 /hour and the PLM Arousal index was 0 /hour.  Post-study, the patient indicated that sleep was better than usual.  POLYSOMNOGRAPHY IMPRESSION :   1. Obstructive Sleep Apnea (OSA)    RECOMMENDATIONS:  1. This patient has severe obstructive sleep apnea. Of note, the absence of REM sleep and supine sleep during the diagnostic portion of the study may underestimate her baseline AHI and O2 nadir. She responded well on CPAP therapy. I will, therefore, start the patient on home CPAP treatment at a pressure of 12 cm via small nasal pillows with heated humidity. The patient should be reminded to be fully compliant with PAP therapy  to improve sleep related symptoms and decrease long term cardiovascular risks. Please note that untreated obstructive sleep apnea carries additional perioperative morbidity. Patients with significant obstructive sleep apnea should receive perioperative PAP therapy and the surgeons and particularly the anesthesiologist should be informed of the diagnosis and the severity of the sleep disordered breathing. 2. The patient should be cautioned not to drive, work at heights, or operate dangerous or heavy equipment when tired or sleepy. Review and reiteration of good sleep hygiene measures should be pursued with any patient. 3. The patient will be seen in follow-up by Dr. Frances Furbish at [facility] for discussion of the test results and further management strategies. The referring provider will be notified of the test results.  I certify that I have reviewed the entire raw data recording prior to the issuance of this report in accordance with the Standards of Accreditation of the American Academy of Sleep Medicine (AASM)   Ladell Heads  Frances Furbish, MD, PhD Diplomat, American Board of Psychiatry and Neurology (Neurology and Sleep Medicine)

## 2017-09-27 NOTE — Telephone Encounter (Signed)
I called pt. I advised pt that Dr. Athar reviewed their sleep stuFrances Furbishdy results and found that pt has severe osa. Dr. Frances FurbishAthar recommends that pt start a cpap at home. I reviewed PAP compliance expectations with the pt. Pt is agreeable to starting a CPAP. I advised pt that an order will be sent to a DME, [facility], and [facility] will call the pt within about one week after they file with the pt's insurance. [facility] will show the pt how to use the machine, fit for masks, and troubleshoot the CPAP if needed. A follow up appt was made for insurance purposes with Dr. Frances FurbishAthar on 12/06/17 at 2:00pm. Pt verbalized understanding to arrive 15 minutes early and bring their CPAP. A letter with all of this information in it will be mailed to the pt as a reminder. I verified with the pt that the address we have on file is correct. Pt verbalized understanding of results. Pt had no questions at this time but was encouraged to call back if questions arise.

## 2017-09-27 NOTE — Progress Notes (Signed)
Patient referred by Porfirio Oarhelle Jeffery, PA, seen by me on 08/04/17, split night sleep study on 09/26/17. Please call and notify patient that the recent sleep study confirmed the diagnosis of severe OSA. She did well with CPAP during the study with significant improvement of the respiratory events. Therefore, I would like start the patient on CPAP therapy at home by prescribing a machine for home use. I placed the order in the chart.  Please advise patient that we need a follow up appointment with either myself or one of our nurse practitioners in about 10 weeks post set-up to check for how the patient is feeling and how well the patient is using the machine, etc. Please go ahead and schedule the appointment, while you have the patient on the phone and make sure patient understands the importance of keeping this window for the FU appointment, as it is often an insurance requirement. Failing to adhere to this may result in losing coverage for sleep apnea treatment, at which point most patients are left with a choice of returning the machine or paying out of pocket (and we want neither of this to happen!).  Please re-enforce the importance of compliance with treatment and the need for us to monitor compliance data - again an insurance requirement and usually a good feedback for the patient as far as how they are doing.  Also remind patient, that any PAP machine or mask issues should be first addressed with the DME company, who provided the machine/mask.  Please ask if patient has a preference regarding DME company, may depend on the insurance too.  Please arrange for CPAP set up at home through a DME company of patient's choice.  Once you have spoken to the patient you can close the phone encounter. Please fax/route report to referring provider, thanks,   Huston FoleySaima Champagne Paletta, MD, PhD [facility])

## 2017-09-27 NOTE — Telephone Encounter (Signed)
-----   Message from Huston FoleySaima Athar, MD sent at 09/27/2017  8:42 AM EST ----- Patient referred by Porfirio Oarhelle Jeffery, PA, seen by me on 08/04/17, split night sleep study on 09/26/17. Please call and notify patient that the recent sleep study confirmed the diagnosis of severe OSA. She did well with CPAP during the study with significant improvement of the respiratory events. Therefore, I would like start the patient on CPAP therapy at home by prescribing a machine for home use. I placed the order in the chart.  Please advise patient that we need a follow up appointment with either myself or one of our nurse practitioners in about 10 weeks post set-up to check for how the patient is feeling and how well the patient is using the machine, etc. Please go ahead and schedule the appointment, while you have the patient on the phone and make sure patient understands the importance of keeping this window for the FU appointment, as it is often an insurance requirement. Failing to adhere to this may result in losing coverage for sleep apnea treatment, at which point most patients are left with a choice of returning the machine or paying out of pocket (and we want neither of this to happen!).  Please re-enforce the importance of compliance with treatment and the need for us to monitor compliance data - again an insurance requirement and usually a good feedback for the patient as far as how they are doing.  Also remind patient, that any PAP machine or mask issues should be first addressed with the DME company, who provided the machine/mask.  Please ask if patient has a preference regarding DME company, may depend on the insurance too.  Please arrange for CPAP set up at home through a DME company of patient's choice.  Once you have spoken to the patient you can close the phone encounter. Please fax/route report to referring provider, thanks,   Huston FoleySaima Athar, MD, PhD [facility])

## 2017-10-10 ENCOUNTER — Other Ambulatory Visit: Payer: Self-pay | Admitting: Physician Assistant

## 2017-10-10 DIAGNOSIS — F329 Major depressive disorder, single episode, unspecified: Secondary | ICD-10-CM

## 2017-10-10 DIAGNOSIS — F419 Anxiety disorder, unspecified: Principal | ICD-10-CM

## 2017-12-04 ENCOUNTER — Encounter: Payer: Self-pay | Admitting: Neurology

## 2017-12-06 ENCOUNTER — Encounter: Payer: Self-pay | Admitting: Neurology

## 2017-12-06 ENCOUNTER — Ambulatory Visit: Payer: BC Managed Care – PPO | Admitting: Neurology

## 2017-12-06 VITALS — BP 154/85 | HR 81 | Ht 61.0 in | Wt 293.0 lb

## 2017-12-06 DIAGNOSIS — G4733 Obstructive sleep apnea (adult) (pediatric): Secondary | ICD-10-CM | POA: Diagnosis not present

## 2017-12-06 DIAGNOSIS — Z9989 Dependence on other enabling machines and devices: Secondary | ICD-10-CM | POA: Diagnosis not present

## 2017-12-06 NOTE — Progress Notes (Signed)
Subjective:    Patient ID: Cassandra Barrera is a 47 y.o. female.  HPI     Interim history:   Cassandra Barrera is a 47 year old right-handed woman with an underlying medical history of anxiety, depression, hypothyroidism, allergies and morbid obesity with a BMI of over 39, who presents for follow-up consultation of Cassandra severe obstructive sleep apnea, after sleep study testing and starting CPAP therapy at home. The patient is unaccompanied today. I first met Cassandra on 08/04/2017 at the request of Cassandra primary care provider, at which time she reported snoring, witnessed apneas as well as daytime somnolence. I asked Cassandra to proceed with a sleep study. She had a split-night sleep study on 09/26/2017. I went over Cassandra test results with Cassandra in detail today. Baseline sleep efficiency was 81.9% with a sleep latency of 22 minutes and REM sleep was absent. She had an increased percentage of light stage sleep and absence of slow-wave sleep and REM sleep. She had moderate to loud snoring, total AHI was highly elevated at 165.3 per hour in the absence of REM sleep achieved. Overall oxygen saturation was 96% and nadir was in the severe range at 66% with significant time below 89% saturation of over 90 minutes. She was titrated with CPAP from 5 cm to 12 cm. She was fitted with nasal pillows and on the final titration pressure Cassandra AHI was 0 per hour with non-supine REM sleep achieved an O2 nadir of 90%. She had no significant PLMS during the study. She had no significant EKG changes during the study. She had a significant amount of REM rebound during the titration portion of the study. Based on Cassandra test results I prescribed CPAP therapy for home use at a pressure of 12 cm.  Today, 12/06/2017: I reviewed Cassandra CPAP compliance data from 11/05/2017 through 12/04/2017 which is a total of 30 days, during which time she used Cassandra machine every night with percent used days greater than 4 hours at 93.3%, indicating excellent compliance with an  average usage of 7 hours and 7 minutes which is also very good, residual AHI at goal at 0.8 per hour, leak very low, pressure at 12 cm. She reports feeling much improved. Has made a huge difference to start CPAP therapy. She felt the improvement from day 1. She is fully compliant with treatment. Cassandra Barrera moved in with them recently. She has lost a few pounds since November 2018.  The patient's allergies, current medications, family history, past medical history, past social history, past surgical history and problem list were reviewed and updated as appropriate.   Previously:   08/04/2017: (She) reports snoring and excessive daytime somnolence as well as a family history of OSA. Cassandra Barrera has witnessed apneas while she is asleep. I reviewed your office note from 07/28/2017. Cassandra Epworth sleepiness score is 20 out of 24, fatigue score is 32 out of 63. She does not wake up rested. She wakes up with a sense of gasping for air. She lives with Cassandra Barrera and children. She has 2 Barrera, ages 82 and 64. She works for [employer], part-time as a Licensed conveyancer, she also tutors math. She is a nonsmoker and does not utilize alcohol and drinks caffeine in the form of soda, 2-3 per day.  She is usually in bed by 10 PM , she does not watch TV in bed. They do have a dog in the bedroom and sometimes on Cassandra bed. She has a wake time of around 7. She denies  any telltale symptoms of restless leg syndrome or leg twitching at night. She has nocturia about once per night on average. She has come close to dozing off at work. She does not take any scheduled naps but has lately fallen asleep inadvertently when sedentary at home. She has gained some weight in the past few years. She has gradually become more sleepy during the day. Cassandra Barrera has obstructive sleep apnea and uses a CPAP machine.   Cassandra Past Medical History Is Significant For: Past Medical History:  Diagnosis Date  . Anxiety   . Depression   .  Thyroid disease    underactive    Cassandra Past Surgical History Is Significant For: No past surgical history on file.  Cassandra Family History Is Significant For: Family History  Problem Relation Age of Onset  . Hypertension Mother   . Diabetes Mother   . Heart disease Mother   . Diabetes Barrera     Cassandra Social History Is Significant For: Social History   Socioeconomic History  . Marital status: Married    Spouse name: Jacqulyn Bath "Fara Olden"  . Number of children: 2  . Years of education: college  . Highest education level: None  Social Needs  . Financial resource strain: None  . Food insecurity - worry: None  . Food insecurity - inability: None  . Transportation needs - medical: None  . Transportation needs - non-medical: None  Occupational History  . 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 Smoking status: Never Smoker  . Smokeless tobacco: Never Used  Substance and Sexual Activity  . Alcohol use: No    Alcohol/week: 0.0 oz  . Drug use: No  . Sexual activity: Yes    Birth control/protection: IUD  Other Topics Concern  . None  Social History Narrative   Lives with Cassandra Barrera and their 2 Barrera.   Parents live in [place].   Cassandra Barrera and Cassandra Barrera's family live in [place] at [place].    Cassandra Allergies Are:  Allergies  Allergen Reactions  . Sulfa Antibiotics Other (See Comments)    Reaction: lightheaded  :   Cassandra Current Medications Are:  Outpatient Encounter Medications as of 12/06/2017  Medication Sig  . levothyroxine (SYNTHROID, LEVOTHROID) 175 MCG tablet Take 1 tablet (175 mcg total) by mouth daily.  . Loratadine (CLARITIN PO) Take by mouth daily.  . sertraline (ZOLOFT) 50 MG tablet TAKE 1 TABLET(50 MG) BY MOUTH DAILY   No facility-administered encounter medications on file as of 12/06/2017.   :  Review of Systems:  Out of a complete 14 point review of systems, all are reviewed and negative with the exception of these symptoms  as listed below:  Review of Systems  Neurological:       Pt presents today to follow up on Cassandra cpap. Pt feels much better on cpap therapy.    Objective:  Neurological Exam  Physical Exam Physical Examination:   Vitals:   12/06/17 1409  BP: (!) 154/85  Pulse: 81   General Examination: The patient is a very pleasant 47 y.o. female in no acute distress. She appears well-developed and well-nourished and well groomed.   HEENT: Normocephalic, atraumatic, pupils are equal, round and reactive to light and accommodation. Extraocular tracking is good without limitation to gaze excursion or nystagmus noted. Normal smooth pursuit is noted. Hearing is grossly intact. Face is symmetric with normal facial animation and normal facial sensation. Speech is clear with no dysarthria  noted. There is no hypophonia. There is no lip, neck/head, jaw or voice tremor. Neck is supple with full range of passive and active motion. Oropharynx exam reveals: mild mouth dryness, good dental hygiene and moderate airway crowding. Mallampati is class III. She has a small mouth opening as well.   Chest: Clear to auscultation without wheezing, rhonchi or crackles noted.  Heart: S1+S2+0, regular and normal without murmurs, rubs or gallops noted.   Abdomen: Soft, non-tender and non-distended.  Extremities: There is non pitting puffiness in Cassandra feet and ankles.   Skin: Warm and dry without trophic changes noted.  Musculoskeletal: exam reveals no obvious joint deformities, tenderness or joint swelling or erythema.   Neurologically:  Mental status: The patient is awake, alert and oriented in all 4 spheres. Cassandra immediate and remote memory, attention, language skills and fund of knowledge are appropriate. There is no evidence of aphasia, agnosia, apraxia or anomia. Speech is clear with normal prosody and enunciation. Thought process is linear. Mood is normal and affect is normal.  Cranial nerves II - XII are as  described above under HEENT exam.  Motor exam: Normal bulk, strength and tone is noted. There is no drift, or tremor. Romberg is negative. Fine motor skills and coordination: grossly intact.  Cerebellar testing: No dysmetria or intention tremor. There is no truncal or gait ataxia.  Sensory exam: intact to light touch in the upper and lower extremities.  Gait, station and balance: She stands easily. No veering to one side is noted. No leaning to one side is noted. Posture is age-appropriate and stance is narrow based. Gait shows normal stride length and normal pace. No problems turning are noted. Tandem walk is unremarkable.   Assessment and Plan:  In summary, CASSADEE VANZANDT is a very pleasant 47 year old female with an underlying medical history of anxiety, depression, hypothyroidism, allergies and morbid obesity with a BMI of over 56, who presents for follow-up consultation of Cassandra severe obstructive sleep apnea. She had a split-night sleep study in January 2019 and has established treatment with CPAP therapy at a pressure of 12 cm. She is fully compliant with treatment and highly commended for this. She feels much improved as far as Cassandra daytime sleepiness and nighttime sleep consolidation as well as sleep quality go. She is very pleased with Cassandra outcome and I encouraged Cassandra strongly to be fully compliant with treatment ongoing. She is encouraged to pursue weight loss more aggressively. She is working on this. Physical exam is stable with the exception of about 5 pounds of weight loss since November 2018. She is commended for Cassandra weight loss endeavors as well. She is using Cassandra CPAP for an average of a little over 7 hours which is great. I suggested a brief checkup routinely in 6 months with one of our initial tissues and then yearly thereafter. We talked about Cassandra sleep study results in detail today as well as reviewed Cassandra compliance data together. I answered all Cassandra questions today and she was in  agreement with the plan. I spent 25 minutes in total face-to-face time with the patient, more than 50% of which was spent in counseling and coordination of care, reviewing test results, reviewing medication and discussing or reviewing the diagnosis of OSA, its prognosis and treatment options. Pertinent laboratory and imaging test results that were available during this visit with the patient were reviewed by me and considered in my medical decision making (see chart for details).

## 2017-12-06 NOTE — Patient Instructions (Signed)
Please continue using your CPAP regularly. While your insurance requires that you use CPAP at least 4 hours each night on 70% of the nights, I recommend, that you not skip any nights and use it throughout the night if you can. Getting used to CPAP and staying with the treatment long term does take time and patience and discipline. Untreated obstructive sleep apnea when it is moderate to severe can have an adverse impact on cardiovascular health and raise her risk for heart disease, arrhythmias, hypertension, congestive heart failure, stroke and diabetes. Untreated obstructive sleep apnea causes sleep disruption, nonrestorative sleep, and sleep deprivation. This can have an impact on your day to day functioning and cause daytime sleepiness and impairment of cognitive function, memory loss, mood disturbance, and problems focussing. Using CPAP regularly can improve these symptoms.   You have done a great job using your machine! Keep up the good work! Now, please focus on weight loss.  We can see you in 6 months, you can see one of our nurse practitioners. We will see you once a year thereafter.

## 2017-12-22 ENCOUNTER — Encounter: Payer: Self-pay | Admitting: Physician Assistant

## 2018-01-14 ENCOUNTER — Other Ambulatory Visit: Payer: Self-pay | Admitting: Physician Assistant

## 2018-01-14 DIAGNOSIS — F329 Major depressive disorder, single episode, unspecified: Secondary | ICD-10-CM

## 2018-01-14 DIAGNOSIS — F419 Anxiety disorder, unspecified: Principal | ICD-10-CM

## 2018-01-14 DIAGNOSIS — F32A Depression, unspecified: Secondary | ICD-10-CM

## 2018-01-16 ENCOUNTER — Other Ambulatory Visit: Payer: Self-pay | Admitting: Physician Assistant

## 2018-01-16 DIAGNOSIS — F329 Major depressive disorder, single episode, unspecified: Secondary | ICD-10-CM

## 2018-01-16 DIAGNOSIS — F419 Anxiety disorder, unspecified: Principal | ICD-10-CM

## 2018-01-16 NOTE — Telephone Encounter (Signed)
Needs visit for further refills

## 2018-04-20 ENCOUNTER — Other Ambulatory Visit: Payer: Self-pay

## 2018-04-20 ENCOUNTER — Encounter: Payer: Self-pay | Admitting: Physician Assistant

## 2018-04-20 ENCOUNTER — Ambulatory Visit: Payer: BC Managed Care – PPO | Admitting: Physician Assistant

## 2018-04-20 ENCOUNTER — Telehealth: Payer: Self-pay | Admitting: Family Medicine

## 2018-04-20 VITALS — BP 116/63 | HR 87 | Temp 98.7°F | Resp 17 | Ht 61.0 in | Wt 285.0 lb

## 2018-04-20 DIAGNOSIS — F329 Major depressive disorder, single episode, unspecified: Secondary | ICD-10-CM | POA: Diagnosis not present

## 2018-04-20 DIAGNOSIS — J302 Other seasonal allergic rhinitis: Secondary | ICD-10-CM | POA: Diagnosis not present

## 2018-04-20 DIAGNOSIS — F419 Anxiety disorder, unspecified: Secondary | ICD-10-CM | POA: Diagnosis not present

## 2018-04-20 DIAGNOSIS — E039 Hypothyroidism, unspecified: Secondary | ICD-10-CM

## 2018-04-20 DIAGNOSIS — F32A Depression, unspecified: Secondary | ICD-10-CM

## 2018-04-20 MED ORDER — LEVOTHYROXINE SODIUM 175 MCG PO TABS: 175.0000 ug | ORAL_TABLET | Freq: Every day | ORAL | 6 refills | Status: DC

## 2018-04-20 MED ORDER — SERTRALINE HCL 50 MG PO TABS: ORAL_TABLET | ORAL | 3 refills | Status: DC

## 2018-04-20 MED ORDER — LORATADINE 10 MG PO TABS: 10.0000 mg | ORAL_TABLET | Freq: Every day | ORAL | 11 refills | Status: AC

## 2018-04-20 NOTE — Patient Instructions (Addendum)
Start taking sertraline 100 mg.  If you are not experiencing improvement in 3 weeks you can increase it to 150 mg daily.  Let me know what dose works for you and I will fill your medication at this dose.  If you continue to not experience improvement let me know and we will add on another medication. Be sure to carve out time for yourself and do things that bring you happiness.  Come back in 6 months for annual exam.   We recommend that you schedule a mammogram for breast cancer screening. Typically, you do not need a referral to do this. Please contact a local imaging center to schedule your mammogram.  [facility] ([place]) - [phone] or [phone]  [place] [phone] [place] [phone] [facility] - [phone]  *ask for the Radiology Department [facility] - [phone]  Thank you for coming in today. I hope you feel we met your needs.  Feel free to call PCP if you have any questions or further requests.  Please consider signing up for MyChart if you do not already have it, as this is a great way to communicate with me.  Best,  [employer], [name]

## 2018-04-20 NOTE — Progress Notes (Signed)
Cassandra Barrera  MRN: [id number] DOB: 08/22/1971  PCP: Cassandra Barrera, [name], [name]  Subjective:  Pt is a [age] year old female who presents to clinic for thyroid check and medication refill: synthroid loratadine and zoloft.   Hypothyroidism - Synthroid 150 mcg/day. Last OV for this 07/2017 - TSH 13.3. Increased synthroid to 175mcg after lab results. She is taking 175mcg currently.   Went for sleep study - OSA. She is using CPAP nightly and feels much better after starting this "I cannot sleep without it".   Anxiety x 15-20 years - sertraline 50mg /daily x a few years. She would like to increase dose. "I feel like i'm more anxious at random times". Her daughter is a [name] in high school. Her mother in law moved in with them - mother in law has alzheimer'Barrera. She has not associated increased stress at home with her increased level of anxiety.  Denies SI or HI  She has tried: Paxil  Not UTD PAP or MM  Review of Systems  Constitutional: Negative for diaphoresis and fatigue.  Endocrine: Negative for cold intolerance and heat intolerance.  Psychiatric/Behavioral: Negative for dysphoric mood, self-injury and suicidal ideas. The patient is nervous/anxious.     Patient Active Problem List   Diagnosis Date Noted  . Sleep apnea 07/28/2017  . Hypothyroidism 03/06/2013  . Anxiety and depression 03/06/2013    Current Outpatient Medications on File Prior to Visit  Medication Sig Dispense Refill  . levothyroxine (SYNTHROID, LEVOTHROID) 175 MCG tablet Take 1 tablet (175 mcg total) by mouth daily. 90 tablet 3  . Loratadine (CLARITIN PO) Take by mouth daily.    . sertraline (ZOLOFT) 50 MG tablet TAKE 1 TABLET(50 MG) BY MOUTH DAILY 30 tablet 0   No current facility-administered medications on file prior to visit.     Allergies  Allergen Reactions  . Sulfa Antibiotics Other (See Comments)    Reaction: lightheaded     Objective:  BP 116/63 (BP Location: Right Arm, Patient Position: Sitting, Cuff  Size: Large)   Pulse 87   Temp 98.7 F (37.1 C) (Oral)   Resp 17   Ht 5\' 1"  (1.549 m)   Wt 285 lb (129.3 kg)   SpO2 97%   BMI 53.85 kg/m   Physical Exam  Constitutional: She is oriented to person, place, and time. No distress.  Obese  Cardiovascular: Normal rate, regular rhythm and normal heart sounds.  Neurological: She is alert and oriented to person, place, and time.  Skin: Skin is warm and dry.  Psychiatric: Judgment normal.  Vitals reviewed.   Assessment and Plan :  1. Hypothyroidism, unspecified type -Plan to refill Synthroid 175 mcg daily.  TSH is pending will contact with results and possible dose change.  Return to clinic in 6 months for annual exam and repeat TSH. - Thyroid Panel With TSH - levothyroxine (SYNTHROID, LEVOTHROID) 175 MCG tablet; Take 1 tablet (175 mcg total) by mouth daily.  Dispense: 90 tablet; Refill: 6  2. Anxiety and depression -Increased stressors at home are causing increased anxiety.  Plan to increase Zoloft to 100 mg daily with instruction to increase to 150 if needed after 3 weeks.  She will check in with me via my chart if she is feeling well.  If she is not improving consider adding on second medication. - sertraline (ZOLOFT) 50 MG tablet; TAKE 2 TABLET(100 MG) BY MOUTH DAILY. May increase to 150mg  after 3 weeks if needed  Dispense: 90 tablet; Refill: 3  3. Seasonal allergies -  Controlled. - loratadine (CLARITIN) 10 MG tablet; Take 1 tablet (10 mg total) by mouth daily.  Dispense: 30 tablet; Refill: 11   Cassandra Cassandra Buford, [name]  Primary Care at [facility] 04/20/2018 4:04 PM  Please note: Portions of this report may have been transcribed using dragon voice recognition software. Every effort was made to ensure accuracy; however, inadvertent computerized transcription errors may be present.

## 2018-04-21 LAB — THYROID PANEL WITH TSH
Free Thyroxine Index: 2 (ref 1.2–4.9)
T3 Uptake Ratio: 28 % (ref 24–39)
T4, Total: 7.1 ug/dL (ref 4.5–12.0)
TSH: 9.57 u[IU]/mL — ABNORMAL HIGH (ref 0.450–4.500)

## 2018-04-28 ENCOUNTER — Other Ambulatory Visit: Payer: Self-pay | Admitting: Physician Assistant

## 2018-04-28 DIAGNOSIS — E039 Hypothyroidism, unspecified: Secondary | ICD-10-CM

## 2018-04-28 MED ORDER — LEVOTHYROXINE SODIUM 200 MCG PO TABS: 200.0000 ug | ORAL_TABLET | Freq: Every day | ORAL | 2 refills | Status: DC

## 2018-04-28 NOTE — Progress Notes (Signed)
Please call pt: Your TSH is still a bit high. I sent in a prescription for Synthroid 26000mcg/day.  Start taking this and come back in 6 months for recheck TSH and annual exam.

## 2018-06-06 ENCOUNTER — Encounter: Payer: Self-pay | Admitting: Nurse Practitioner

## 2018-06-07 NOTE — Progress Notes (Addendum)
[facility]  PATIENT: Cassandra Barrera DOB: [birth year]   REASON FOR VISIT: Follow-up for obstructive sleep apnea here for CPAP compliance HISTORY FROM:patient    HISTORY OF PRESENT ILLNESS: Cassandra Barrera is a 47 year old right-handed woman with an underlying medical history of anxiety, depression, hypothyroidism, allergies and morbid obesity with a BMI of over 80, who presents for follow-up consultation of her severe obstructive sleep apnea, after sleep study testing and starting CPAP therapy at home. The patient is unaccompanied today. I first met her on 08/04/2017 at the request of her primary care provider, at which time she reported snoring, witnessed apneas as well as daytime somnolence. I asked her to proceed with a sleep study. She had a split-night sleep study on 09/26/2017. I went over her test results with her in detail today. Baseline sleep efficiency was 81.9% with a sleep latency of 22 minutes and REM sleep was absent. She had an increased percentage of light stage sleep and absence of slow-wave sleep and REM sleep. She had moderate to loud snoring, total AHI was highly elevated at 165.3 per hour in the absence of REM sleep achieved. Overall oxygen saturation was 96% and nadir was in the severe range at 66% with significant time below 89% saturation of over 90 minutes. She was titrated with CPAP from 5 cm to 12 cm. She was fitted with nasal pillows and on the final titration pressure her AHI was 0 per hour with non-supine REM sleep achieved an O2 nadir of 90%. She had no significant PLMS during the study. She had no significant EKG changes during the study. She had a significant amount of REM rebound during the titration portion of the study. Based on her test results I prescribed CPAP therapy for home use at a pressure of 12 cm.  Today, 12/06/2017: I reviewed her CPAP compliance data from 11/05/2017 through 12/04/2017 which is a total of 30 days, during which time she  used her machine every night with percent used days greater than 4 hours at 93.3%, indicating excellent compliance with an average usage of 7 hours and 7 minutes which is also very good, residual AHI at goal at 0.8 per hour, leak very low, pressure at 12 cm. She reports feeling much improved. Has made a huge difference to start CPAP therapy. She felt the improvement from day 1. She is fully compliant with treatment. Her mother-in-law moved in with them recently. She has lost a few pounds since November 2018 UPDATE 9/19/2019CM Cassandra Barrera, 47 year old female returns for follow-up with history of obstructive sleep apnea here for CPAP compliance.  She has not had any problems with her CPAP.She feels much improved as far as her daytime sleepiness and nighttime sleep consolidation as well as sleep quality.  Compliance data dated 05/08/2018-06/06/2018 shows compliance greater than 4 hours at 100%.  Average usage 7 hours 41 minutes.  Set pressure 12 cm AHI 0.5.  ESS 3.  She returns for reevaluation REVIEW OF SYSTEMS: Full 14 system review of systems performed and notable only for those listed, all others are neg:  Constitutional: neg  Cardiovascular: neg Ear/Nose/Throat: neg  Skin: neg Eyes: neg Respiratory: neg Gastroitestinal: neg  Hematology/Lymphatic: neg  Endocrine: neg Musculoskeletal:neg Allergy/Immunology: neg Neurological: neg Psychiatric: neg Sleep : Obstructive sleep apnea with CPAP   ALLERGIES: Allergies  Allergen Reactions  . Sulfa Antibiotics Other (See Comments)    Reaction: lightheaded    HOME MEDICATIONS: Outpatient Medications Prior to Visit  Medication Sig Dispense Refill  .  levothyroxine (SYNTHROID) 200 MCG tablet Take 1 tablet (200 mcg total) by mouth daily before breakfast. 90 tablet 2  . loratadine (CLARITIN) 10 MG tablet Take 1 tablet (10 mg total) by mouth daily. 30 tablet 11  . sertraline (ZOLOFT) 50 MG tablet TAKE 2 TABLET(100 MG) BY MOUTH DAILY. May increase to 169m  after 3 weeks if needed 90 tablet 3   No facility-administered medications prior to visit.     PAST MEDICAL HISTORY: Past Medical History:  Diagnosis Date  . Anxiety   . Depression   . OSA on CPAP   . Thyroid disease    underactive    PAST SURGICAL HISTORY: History reviewed. No pertinent surgical history.  FAMILY HISTORY: Family History  Problem Relation Age of Onset  . Hypertension Mother   . Diabetes Mother   . Heart disease Mother   . Diabetes Father     SOCIAL HISTORY: Social History   Socioeconomic History  . Marital status: Married    Spouse name: JJacqulyn Bath"JFara Olden  . Number of children: 2  . Years of education: college  . Highest education level: Not on file  Occupational History  . Occupation: part-time mRisk analyst   Comment: [employer] . Financial resource strain: Not on file  . Food insecurity:    Worry: Not on file    Inability: Not on file  . Transportation needs:    Medical: Not on file    Non-medical: Not on file  Tobacco Use  . Smoking status: Never Smoker  . Smokeless tobacco: Never Used  Substance and Sexual Activity  . Alcohol use: No    Alcohol/week: 0.0 standard drinks  . Drug use: No  . Sexual activity: Yes    Birth control/protection: IUD  Lifestyle  . Physical activity:    Days per week: Not on file    Minutes per session: Not on file  . Stress: Not on file  Relationships  . Social connections:    Talks on phone: Not on file    Gets together: Not on file    Attends religious service: Not on file    Active member of club or organization: Not on file    Attends meetings of clubs or organizations: Not on file    Relationship status: Not on file  . Intimate partner violence:    Fear of current or ex partner: Not on file    Emotionally abused: Not on file    Physically abused: Not on file    Forced sexual activity: Not on file  Other Topics Concern  . Not on file  Social History Narrative   Lives with  her husband and their 2 daughters.   [place] in [place]   Her sister [place] her husband's family [place]     PHYSICAL EXAM  Vitals:   06/08/18 1434  BP: 122/78  Pulse: 92  Weight: 282 lb 12.8 oz (128.3 kg)  Height: 5' 1"  (1.549 m)   Body mass index is 53.43 kg/m.  Generalized: Well developed, morbidly obese female in no acute distress  Head: normocephalic and atraumatic,. Oropharynx benign  Neck: Supple,  Musculoskeletal: No deformity   Neurological examination   Mentation: Alert oriented to time, place, history taking. Attention span and concentration appropriate. Recent and remote memory intact.  Follows all commands speech and language fluent.   Cranial nerve II-XII: Pupils were equal round reactive to light extraocular movements were full, visual field were full  on confrontational test. Facial sensation and strength were normal. hearing was intact to finger rubbing bilaterally. Uvula tongue midline. head turning and shoulder shrug were normal and symmetric.Tongue protrusion into cheek strength was normal. Motor: normal bulk and tone, full strength in the BUE, BLE, Sensory: normal and symmetric to light touch,  Coordination: finger-nose-finger, heel-to-shin bilaterally, no dysmetria Gait and Station: Rising up from seated position without assistance, normal stance,  moderate stride, good arm swing, smooth turning, able to perform tiptoe, and heel walking without difficulty. Tandem gait is steady  DIAGNOSTIC DATA (LABS, IMAGING, TESTING) - I reviewed patient records, labs, notes, testing and imaging myself where available.  Lab Results  Component Value Date   WBC 12.5 (H) 07/28/2017   HGB 13.8 07/28/2017   HCT 41.9 07/28/2017   MCV 88 07/28/2017   PLT 406 (H) 07/28/2017      Component Value Date/Time   NA 140 07/28/2017 1602   K 4.5 07/28/2017 1602   CL 100 07/28/2017 1602   CO2 25 07/28/2017 1602   GLUCOSE 76 07/28/2017 1602   GLUCOSE 69 (L)  03/06/2013 1301   BUN 14 07/28/2017 1602   CREATININE 0.66 07/28/2017 1602   CREATININE 0.53 03/06/2013 1301   CALCIUM 9.4 07/28/2017 1602   PROT 7.1 07/28/2017 1602   ALBUMIN 4.2 07/28/2017 1602   AST 16 07/28/2017 1602   ALT 13 07/28/2017 1602   ALKPHOS 62 07/28/2017 1602   BILITOT 0.2 07/28/2017 1602   GFRNONAA 108 07/28/2017 1602   GFRAA 124 07/28/2017 1602   Lab Results  Component Value Date   CHOL 145 03/06/2013   HDL 35 (L) 03/06/2013   LDLCALC 57 03/06/2013   TRIG 264 (H) 03/06/2013   CHOLHDL 4.1 03/06/2013   No results found for: HGBA1C No results found for: VITAMINB12 Lab Results  Component Value Date   TSH 9.570 (H) 04/20/2018      ASSESSMENT AND PLAN  Cassandra Barrera a very pleasant 63 year oldfemalewith an underlying medical history of anxiety, depression, hypothyroidism, allergies and morbid obesity with a BMI of over 38, who presents for follow-up consultation of her severe obstructive sleep apnea. She had a split-night sleep study in January 2019 and has established treatment with CPAP therapy at a pressure of 12 cm. She is fully compliant with treatment. Compliance data dated 05/08/2018-06/06/2018 shows compliance greater than 4 hours at 100%.  Average usage 7 hours 41 minutes.  Set pressure 12 cm AHI 0.5.  ESS 3.  PLAN: CPAP compliance 100% reviewed data with patient Continue same settings Follow-up yearly and as needed Dennie Bible, [facility], [facility], APRN  [facility] [address] [place], [place] [address] [phone]  I reviewed the above note and documentation by the Nurse Practitioner and agree with the history, physical exam, assessment and plan as outlined above. I was immediately available for face-to-face consultation. Star Age, MD, PhD [facility])

## 2018-06-08 ENCOUNTER — Ambulatory Visit: Payer: BC Managed Care – PPO | Admitting: Nurse Practitioner

## 2018-06-08 ENCOUNTER — Encounter: Payer: Self-pay | Admitting: Nurse Practitioner

## 2018-06-08 DIAGNOSIS — G4733 Obstructive sleep apnea (adult) (pediatric): Secondary | ICD-10-CM

## 2018-06-08 DIAGNOSIS — Z9989 Dependence on other enabling machines and devices: Secondary | ICD-10-CM

## 2018-06-08 NOTE — Patient Instructions (Signed)
CPAP compliance 100% Continue same settings Follow-up yearly and as needed  

## 2018-07-13 NOTE — Telephone Encounter (Signed)
DONE

## 2018-08-03 ENCOUNTER — Telehealth: Payer: Self-pay | Admitting: Physician Assistant

## 2018-08-03 NOTE — Telephone Encounter (Signed)
LVM for pt to call the office and reschedule their appt on 10/24/18 with McVey. We will need to get pt rescheduled with either Dr. Creta LevinStallings, Dr. Leretha PolSantiago or Dr. Alvy BimlerSagardia.  When pt calls back, please schedule for an CPE - Establish Care**previously scheduled** - McVey pt - CPE with pap Thank you!

## 2018-09-23 ENCOUNTER — Encounter: Payer: Self-pay | Admitting: Physician Assistant

## 2018-09-27 ENCOUNTER — Other Ambulatory Visit: Payer: Self-pay | Admitting: Physician Assistant

## 2018-09-27 DIAGNOSIS — F329 Major depressive disorder, single episode, unspecified: Secondary | ICD-10-CM

## 2018-09-27 DIAGNOSIS — F419 Anxiety disorder, unspecified: Principal | ICD-10-CM

## 2018-09-27 NOTE — Telephone Encounter (Signed)
Patient called and advised she will need to schedule a physical with a new provider, she agrees to either female doctor, physical scheduled with Dr. Leretha Pol on Thursday, 11/02/18 at 0900. She's asking for a new prescription on the Sertraline, she increased her dosage as on the bottle to 150 mg daily and she says that works for her, so she would like the new prescription to say 150 mg daily. I advised I will send to [facility] or Dr. Leretha Pol for approval.

## 2018-09-27 NOTE — Telephone Encounter (Signed)
Requested medication (s) are due for refill today: Yes  Requested medication (s) are on the active medication list: Yes  Last refill:  04/20/18  Future visit scheduled: Yes  Notes to clinic:  Will need a new Rx with the correct dosage change 150 mg daily. [facility] Le[facility] PolSantiago.     Requested Prescriptions  Pending Prescriptions Disp Refills   sertraline (ZOLOFT) 50 MG tablet [Pharmacy Med Name: SERTRALINE 50MG  TABLETS] 90 tablet 3    Sig: TAKE 2 TABLETS(100 MG) BY MOUTH DAILY. MAY INCREASE TO 150 MG AFTER 3 WEEKS AS NEEDED     Psychiatry:  Antidepressants - SSRI Passed - 09/27/2018 10:39 AM      Passed - Completed PHQ-2 or PHQ-9 in the last 360 days.      Passed - Valid encounter within last 6 months    Recent Outpatient Visits          5 months ago Hypothyroidism, unspecified type   [facility], Madelaine BhatElizabeth Whitney, [name]   1 year ago Sleep apnea, unspecified type   [facility], [place], [place]   2 years ago Hypothyroidism, unspecified type   [facility], [place], [place]   3 years ago Anxiety and depression   [facility], [place], [place]   4 years ago Unspecified hypothyroidism   [facility], Gwenlyn FoundJessica C, MD      Future Appointments            In 1 month Leretha PolSantiago, Meda CoffeeIrma M, MD [facility] [place], [facility]

## 2018-10-07 ENCOUNTER — Encounter: Payer: Self-pay | Admitting: Emergency Medicine

## 2018-10-07 ENCOUNTER — Ambulatory Visit
Admission: EM | Admit: 2018-10-07 | Discharge: 2018-10-07 | Disposition: A | Discharge status: Home / Self Care | Payer: BC Managed Care – PPO | Attending: Emergency Medicine | Admitting: Emergency Medicine

## 2018-10-07 DIAGNOSIS — K0889 Other specified disorders of teeth and supporting structures: Secondary | ICD-10-CM | POA: Diagnosis not present

## 2018-10-07 MED ORDER — AMOXICILLIN-POT CLAVULANATE 875-125 MG PO TABS: 1.0000 | ORAL_TABLET | Freq: Two times a day (BID) | ORAL | 0 refills | Status: AC

## 2018-10-07 MED ORDER — HYDROCODONE-ACETAMINOPHEN 5-325 MG PO TABS: 1.0000 | ORAL_TABLET | Freq: Four times a day (QID) | ORAL | 0 refills | Status: DC | PRN

## 2018-10-07 NOTE — ED Provider Notes (Signed)
[facility]    CSN: [id number] Arrival date & time: 10/07/18  [id number]     History   Chief Complaint Chief Complaint  Patient presents with  . Dental Pain    HPI Cassandra Barrera is a 48 y.o. female history of OSA presenting today for evaluation of dental pain.  Patient has noted pain in her right lower jaw over the past week.  Pain improved, but is worsened again over the past few days.  She has started to notice some swelling today.  Pain waking her up last night.  States that she has had issues with this tooth previously as she has a crown placed.  Does have a dentist set up.  Denies difficulty moving neck.  Denies difficulty swallowing.  HPI  Past Medical History:  Diagnosis Date  . Anxiety   . Depression   . OSA on CPAP   . Thyroid disease    underactive    Patient Active Problem List   Diagnosis Date Noted  . Obstructive sleep apnea on CPAP 06/08/2018  . Sleep apnea 07/28/2017  . Hypothyroidism 03/06/2013  . Anxiety and depression 03/06/2013    History reviewed. No pertinent surgical history.  OB History   No obstetric history on file.      Home Medications    Prior to Admission medications   Medication Sig Start Date End Date Taking? Authorizing Provider  levothyroxine (SYNTHROID) 200 MCG tablet Take 1 tablet (200 mcg total) by mouth daily before breakfast. 04/28/18  Yes McVey, Madelaine BhatElizabeth Whitney, [name]  loratadine (CLARITIN) 10 MG tablet Take 1 tablet (10 mg total) by mouth daily. 04/20/18  Yes McVey, Madelaine BhatElizabeth Whitney, [name]  sertraline (ZOLOFT) 50 MG tablet TAKE 2 TABLETS(100 MG) BY MOUTH DAILY. MAY INCREASE TO 150 MG AFTER 3 WEEKS AS NEEDED 09/29/18  Yes McVey, Madelaine BhatElizabeth Whitney, [name]  amoxicillin-clavulanate (AUGMENTIN) 875-125 MG tablet Take 1 tablet by mouth every 12 (twelve) hours for 7 days. 10/07/18 10/14/18  Zenobia Kuennen C, [name]  HYDROcodone-acetaminophen (NORCO/VICODIN) 5-325 MG tablet Take 1 tablet by mouth every 6 (six) hours as needed for  severe pain. 10/07/18   Zyairah Wacha, Junius CreamerHallie C, [name]    Family History Family History  Problem Relation Age of Onset  . Hypertension Mother   . Diabetes Mother   . Heart disease Mother   . Diabetes Father     Social History Social History   Tobacco Use  . Smoking status: Never Smoker  . Smokeless tobacco: Never Used  Substance Use Topics  . Alcohol use: No    Alcohol/week: 0.0 standard drinks  . Drug use: No     Allergies   Sulfa antibiotics   Review of Systems Review of Systems  Constitutional: Negative for activity change, appetite change, chills, fatigue and fever.  HENT: Positive for dental problem. Negative for congestion, ear pain, rhinorrhea, sinus pressure, sore throat and trouble swallowing.   Eyes: Negative for discharge and redness.  Respiratory: Negative for cough, chest tightness and shortness of breath.   Cardiovascular: Negative for chest pain.  Gastrointestinal: Negative for abdominal pain, diarrhea, nausea and vomiting.  Musculoskeletal: Negative for myalgias.  Skin: Negative for rash.  Neurological: Negative for dizziness, light-headedness and headaches.     Physical Exam Triage Vital Signs ED Triage Vitals  Enc Vitals Group     BP 10/07/18 0847 (!) 153/91     Pulse Rate 10/07/18 0847 74     Resp 10/07/18 0847 18     Temp 10/07/18 0847 97.9  F (36.6 C)     Temp Source 10/07/18 0847 Oral     SpO2 10/07/18 0847 97 %     Weight --      Height --      Head Circumference --      Peak Flow --      Pain Score 10/07/18 0848 4     Pain Loc --      Pain Edu? --      Excl. in GC? --    No data found.  Updated Vital Signs BP (!) 153/91 (BP Location: Left Wrist)   Pulse 74   Temp 97.9 F (36.6 C) (Oral)   Resp 18   SpO2 97%   Visual Acuity Right Eye Distance:   Left Eye Distance:   Bilateral Distance:    Right Eye Near:   Left Eye Near:    Bilateral Near:     Physical Exam Vitals signs and nursing note reviewed.  Constitutional:       Appearance: She is well-developed.     Comments: No acute distress  HENT:     Head: Normocephalic and atraumatic.     Nose: Nose normal.     Mouth/Throat:     Comments: Oral mucosa pink and moist. Posterior pharynx patent and nonerythematous, no uvula deviation or swelling. Normal phonation.  Posterior molars on right side all with crowns, mild gingival swelling and tenderness palpated around right lower jaw, mild swelling to the patient of outer jaw. No soft palate swelling, nontender to palpation beneath tongue Eyes:     Conjunctiva/sclera: Conjunctivae normal.  Neck:     Musculoskeletal: Neck supple.     Comments: No neck swelling or erythema, full active range of motion Cardiovascular:     Rate and Rhythm: Normal rate.  Pulmonary:     Effort: Pulmonary effort is normal. No respiratory distress.  Abdominal:     General: There is no distension.  Musculoskeletal: Normal range of motion.  Skin:    General: Skin is warm and dry.  Neurological:     Mental Status: She is alert and oriented to person, place, and time.      UC Treatments / Results  Labs (all labs ordered are listed, but only abnormal results are displayed) Labs Reviewed - No data to display  EKG None  Radiology No results found.  Procedures Procedures (including critical care time)  Medications Ordered in UC Medications - No data to display  Initial Impression / Assessment and Plan / UC Course  I have reviewed the triage vital signs and the nursing notes.  Pertinent labs & imaging results that were available during my care of the patient were reviewed by me and considered in my medical decision making (see chart for details).    Patient likely with dental infection versus abscess.  Will treat with Augmentin.  Tylenol and ibuprofen for mild to moderate pain.  Did provide 2 days worth of hydrocodone to use for severe pain.  Continue to monitor, follow-up with dentistry.Discussed strict return precautions.  Patient verbalized understanding and is agreeable with plan.  Final Clinical Impressions(s) / UC Diagnoses   Final diagnoses:  Pain, dental     Discharge Instructions     Follow up with dentistry  Today we have given you an antibiotic. This should help with pain as any infection is cleared.   For pain please take 600mg -800mg  of Ibuprofen every 8 hours, take with 1000 mg of Tylenol Extra strength every 8 hours. These  are safe to take together. Please take with food.   I have also provided 2 days worth of stronger pain medication. This should only be used for severe pain. Do not drive or operate machinery while taking this medication.   Please return if you start to experience significant swelling of your face, experiencing fever.   ED Prescriptions    Medication Sig Dispense Auth. Provider   amoxicillin-clavulanate (AUGMENTIN) 875-125 MG tablet Take 1 tablet by mouth every 12 (twelve) hours for 7 days. 14 tablet Vivion Romano C, [name]   HYDROcodone-acetaminophen (NORCO/VICODIN) 5-325 MG tablet Take 1 tablet by mouth every 6 (six) hours as needed for severe pain. 8 tablet Haniyyah Sakuma, [place] C, [name]     Controlled Substance Prescriptions [place] Controlled Substance Registry consulted? Yes, I have consulted the [place] Controlled Substances Registry for this patient, and feel the risk/benefit ratio today is favorable for proceeding with this prescription for a controlled substance.   Sharyon Cable Pegram C, [name] 10/07/18 [phone]

## 2018-10-07 NOTE — ED Triage Notes (Signed)
Pt presents to [facility] for assessment of right lower dental pain x 1 week, was improving, now worsening.  Denies fevers.

## 2018-10-07 NOTE — Discharge Instructions (Signed)
Follow up with dentistry ° °Today we have given you an antibiotic. This should help with pain as any infection is cleared.  ° °For pain please take 600mg-800mg of Ibuprofen every 8 hours, take with 1000 mg of Tylenol Extra strength every 8 hours. These are safe to take together. Please take with food.  ° °I have also provided 2 days worth of stronger pain medication. This should only be used for severe pain. Do not drive or operate machinery while taking this medication.  ° °Please return if you start to experience significant swelling of your face, experiencing fever. °

## 2018-10-07 NOTE — ED Notes (Signed)
Patient able to ambulate independently  

## 2018-10-24 ENCOUNTER — Encounter: Payer: BC Managed Care – PPO | Admitting: Physician Assistant

## 2018-11-02 ENCOUNTER — Encounter: Payer: Self-pay | Admitting: Family Medicine

## 2018-12-18 ENCOUNTER — Encounter: Payer: Self-pay | Admitting: Family Medicine

## 2019-03-07 ENCOUNTER — Other Ambulatory Visit: Payer: Self-pay | Admitting: Physician Assistant

## 2019-03-07 DIAGNOSIS — F32A Depression, unspecified: Secondary | ICD-10-CM

## 2019-03-07 DIAGNOSIS — F329 Major depressive disorder, single episode, unspecified: Secondary | ICD-10-CM

## 2019-03-13 ENCOUNTER — Encounter: Payer: Self-pay | Admitting: Family Medicine

## 2019-03-13 ENCOUNTER — Ambulatory Visit: Payer: BC Managed Care – PPO | Admitting: Family Medicine

## 2019-03-13 ENCOUNTER — Other Ambulatory Visit: Payer: Self-pay

## 2019-03-13 VITALS — BP 126/84 | HR 95 | Temp 98.7°F | Resp 17 | Ht 61.0 in | Wt 292.0 lb

## 2019-03-13 DIAGNOSIS — F419 Anxiety disorder, unspecified: Secondary | ICD-10-CM | POA: Diagnosis not present

## 2019-03-13 DIAGNOSIS — E039 Hypothyroidism, unspecified: Secondary | ICD-10-CM

## 2019-03-13 DIAGNOSIS — F32A Depression, unspecified: Secondary | ICD-10-CM

## 2019-03-13 DIAGNOSIS — F329 Major depressive disorder, single episode, unspecified: Secondary | ICD-10-CM

## 2019-03-13 DIAGNOSIS — Z6841 Body Mass Index (BMI) 40.0 and over, adult: Secondary | ICD-10-CM | POA: Diagnosis not present

## 2019-03-13 MED ORDER — SERTRALINE HCL 50 MG PO TABS: 150.0000 mg | ORAL_TABLET | Freq: Every day | ORAL | 3 refills | Status: DC

## 2019-03-13 NOTE — Patient Instructions (Signed)
° ° ° °  If you have lab work done today you will be contacted with your lab results within the next 2 weeks.  If you have not heard from us then please contact us. The fastest way to get your results is to register for My Chart. ° ° °IF you received an x-ray today, you will receive an invoice from [facility]. Please contact [facility] at [phone] with questions or concerns regarding your invoice.  ° °IF you received labwork today, you will receive an invoice from [facility]. Please contact [facility] at [phone] with questions or concerns regarding your invoice.  ° °Our billing staff will not be able to assist you with questions regarding bills from these companies. ° °You will be contacted with the lab results as soon as they are available. The fastest way to get your results is to activate your My Chart account. Instructions are located on the last page of this paperwork. If you have not heard from us regarding the results in 2 weeks, please contact this office. °  ° ° ° °

## 2019-03-13 NOTE — Progress Notes (Signed)
Established Patient Office Visit  Subjective:  Patient ID: Cassandra Barrera, female    DOB: 05/21/1971  Age: 48 y.o. MRN: [id number]  CC:  Chief Complaint  Patient presents with  . 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[employer]  [facility] 03/13/2019 03/13/2019 04/20/2018 07/28/2017 07/19/2016  Decreased Interest 0 0 0 0 0  Down, Depressed, Hopeless 0 0 0 0 0  PHQ - 2 Score 0 0 0 0 0  Altered sleeping 0 - - - -  Tired, decreased energy 0 - - - -  Change in appetite 0 - - - -  Feeling bad or failure about yourself  0 - - - -  Trouble concentrating 0 - - - -  Moving slowly or fidgety/restless 0 - - - -  Suicidal thoughts 0 - - - -  PHQ-9 Score 0 - - - -  Difficult doing work/chores Not difficult at all - - - -    Hypothyroidism Hypothyroidism Cassandra LarsenStephanie S Barrera is a 48 y.o. female who presents for follow up of hypothyroidism. Current symptoms: none . Patient denies change in energy level, diarrhea, heat / cold intolerance, nervousness, palpitations and weight changes. Symptoms have stabilized.  Lab Results  Component Value Date   TSH 9.570 (H) 04/20/2018   Takes her medications on an empty stomach by itself She is on levothyroxine 200mcg  Wt Readings from Last 3 Encounters:  03/13/19 292 lb (132.5 kg)  06/08/18 282 lb 12.8 oz (128.3 kg)  04/20/18 285 lb (129.3 kg)     Past Medical History:  Diagnosis Date  . Anxiety   . Depression   . OSA on CPAP   . Thyroid disease    underactive    No past surgical history on file.  Family History  Problem Relation Age of Onset  . Hypertension Mother   . Diabetes Mother   . Heart disease Mother   . Diabetes Father      Social History   Socioeconomic History  . Marital status: Married    Spouse name: Elita QuickJose "Francis DowseJoel"  . Number of children: 2  . Years of education: college  . Highest education level: Not on file  Occupational History  . Occupation: part-time Designer, multimediamath tutor    Comment: [employer]  Social Needs  . Financial resource strain: Not on file  . Food insecurity    Worry: Not on file    Inability: Not on file  . Transportation needs    Medical: Not on file    Non-medical: Not on file  Tobacco Use  . Smoking status: Never Smoker  . Smokeless tobacco: Never Used  Substance and Sexual Activity  . Alcohol use: No    Alcohol/week: 0.0 standard drinks  . Drug use: No  . Sexual activity: Yes    Birth control/protection: I.U.D.  Lifestyle  . Physical activity    Days per week: Not on file    Minutes per session: Not on file  . Stress: Not on file  Relationships  . Social Musicianconnections    Talks on phone: Not on file    Gets together: Not on file  Attends religious service: Not on file    Active member of club or organization: Not on file    Attends meetings of clubs or organizations: Not on file    Relationship status: Not on file  . Intimate partner violence    Fear of current or ex partner: Not on file    Emotionally abused: Not on file    Physically abused: Not on file    Forced sexual activity: Not on file  Other Topics Concern  . Not on file  Social History Narrative   Lives with her husband and their 2 daughters.   Parents live in [place].   Her sister and her husband's family live in [place].    Outpatient Medications Prior to Visit  Medication Sig Dispense Refill  . levothyroxine (SYNTHROID) 200 MCG tablet Take 1 tablet (200 mcg total) by mouth daily before breakfast. 90 tablet 2  . loratadine (CLARITIN) 10 MG tablet Take 1 tablet (10 mg total) by mouth daily. 30 tablet 11  . sertraline (ZOLOFT) 50 MG tablet TAKE 2 TABLETS(100 MG) BY MOUTH DAILY. MAY INCREASE  TO 150 MG AFTER 3 WEEKS AS NEEDED 90 tablet 3  . HYDROcodone-acetaminophen (NORCO/VICODIN) 5-325 MG tablet Take 1 tablet by mouth every 6 (six) hours as needed for severe pain. (Patient not taking: Reported on 03/13/2019) 8 tablet 0   No facility-administered medications prior to visit.     Allergies  Allergen Reactions  . Sulfa Antibiotics Other (See Comments)    Reaction: lightheaded    ROS Review of Systems  Review of Systems  Constitutional: Negative for activity change, appetite change, chills and fever.  HENT: Negative for congestion, nosebleeds, trouble swallowing and voice change.   Respiratory: Negative for cough, shortness of breath and wheezing.   Gastrointestinal: Negative for diarrhea, nausea and vomiting.  Genitourinary: Negative for difficulty urinating, dysuria, flank pain and hematuria.  Musculoskeletal: Negative for back pain, joint swelling and neck pain.  Neurological: Negative for dizziness, speech difficulty, light-headedness and numbness.  See HPI. All other review of systems negative.     Objective:    Physical Exam  BP 126/84 (BP Location: Right Arm, Patient Position: Sitting, Cuff Size: Large)   Pulse 95   Temp 98.7 F (37.1 C) (Oral)   Resp 17   Ht 5\' 1"  (1.549 m)   Wt 292 lb (132.5 kg)   SpO2 99%   BMI 55.17 kg/m  Wt Readings from Last 3 Encounters:  03/13/19 292 lb (132.5 kg)  06/08/18 282 lb 12.8 oz (128.3 kg)  04/20/18 285 lb (129.3 kg)   Physical Exam  Constitutional: Oriented to person, place, and time. Appears well-developed and well-nourished.  HENT:  Head: Normocephalic and atraumatic.  Eyes: Conjunctivae and EOM are normal.  Cardiovascular: Normal rate, regular rhythm, normal heart sounds and intact distal pulses.  No murmur heard. Pulmonary/Chest: Effort normal and breath sounds normal. No stridor. No respiratory distress. Has no wheezes.  Neurological: Is alert and oriented to person, place, and time.  Skin: Skin is warm.  Capillary refill takes less than 2 seconds.  Psychiatric: Has a normal mood and affect. Behavior is normal. Judgment and thought content normal.    Health Maintenance Due  Topic Date Due  . PAP SMEAR-Modifier  09/27/1993  . TETANUS/TDAP  01/08/2019    There are no preventive care reminders to display for this patient.  Lab Results  Component Value Date   TSH 9.570 (H) 04/20/2018   Lab Results  Component Value Date  WBC 12.5 (H) 07/28/2017   HGB 13.8 07/28/2017   HCT 41.9 07/28/2017   MCV 88 07/28/2017   PLT 406 (H) 07/28/2017   Lab Results  Component Value Date   NA 140 07/28/2017   K 4.5 07/28/2017   CO2 25 07/28/2017   GLUCOSE 76 07/28/2017   BUN 14 07/28/2017   CREATININE 0.66 07/28/2017   BILITOT 0.2 07/28/2017   ALKPHOS 62 07/28/2017   AST 16 07/28/2017   ALT 13 07/28/2017   PROT 7.1 07/28/2017   ALBUMIN 4.2 07/28/2017   CALCIUM 9.4 07/28/2017   Lab Results  Component Value Date   CHOL 145 03/06/2013   Lab Results  Component Value Date   HDL 35 (L) 03/06/2013   Lab Results  Component Value Date   LDLCALC 57 03/06/2013   Lab Results  Component Value Date   TRIG 264 (H) 03/06/2013   Lab Results  Component Value Date   CHOLHDL 4.1 03/06/2013   No results found for: HGBA1C    Assessment & Plan:   Problem List Items Addressed This Visit      Endocrine   Hypothyroidism - Primary   Relevant Orders   CBC   VITAMIN D 25 Hydroxy (Vit-D Deficiency, Fractures)   Lipid panel   TSH     Other   Anxiety and depression  - stable on    Morbid obesity with BMI of 50.0-59.9, adult (HCC)  -  Discussed weight loss options including weight watchers, wellbutrin, metformin      No orders of the defined types were placed in this encounter.   Follow-up: No follow-ups on file.    Doristine BosworthZoe A Miara Emminger, MD

## 2019-03-14 LAB — CBC
Hematocrit: 42.3 % (ref 34.0–46.6)
Hemoglobin: 13.7 g/dL (ref 11.1–15.9)
MCH: 28.8 pg (ref 26.6–33.0)
MCHC: 32.4 g/dL (ref 31.5–35.7)
MCV: 89 fL (ref 79–97)
Platelets: 385 10*3/uL (ref 150–450)
RBC: 4.76 x10E6/uL (ref 3.77–5.28)
RDW: 13.2 % (ref 11.7–15.4)
WBC: 11.9 10*3/uL — ABNORMAL HIGH (ref 3.4–10.8)

## 2019-03-14 LAB — LIPID PANEL
Chol/HDL Ratio: 4.8 ratio — ABNORMAL HIGH (ref 0.0–4.4)
Cholesterol, Total: 163 mg/dL (ref 100–199)
HDL: 34 mg/dL — ABNORMAL LOW (ref >39)
LDL Calculated: 79 mg/dL (ref 0–99)
Triglycerides: 250 mg/dL — ABNORMAL HIGH (ref 0–149)
VLDL Cholesterol Cal: 50 mg/dL — ABNORMAL HIGH (ref 5–40)

## 2019-03-14 LAB — TSH: TSH: 9.05 u[IU]/mL — ABNORMAL HIGH (ref 0.450–4.500)

## 2019-03-14 LAB — VITAMIN D 25 HYDROXY (VIT D DEFICIENCY, FRACTURES): Vit D, 25-Hydroxy: 23.9 ng/mL — ABNORMAL LOW (ref 30.0–100.0)

## 2019-03-15 MED ORDER — LEVOTHYROXINE SODIUM 25 MCG PO TABS: 25.0000 ug | ORAL_TABLET | Freq: Every day | ORAL | 0 refills | Status: DC

## 2019-03-15 MED ORDER — LEVOTHYROXINE SODIUM 200 MCG PO TABS: 200.0000 ug | ORAL_TABLET | Freq: Every day | ORAL | 2 refills | Status: DC

## 2019-03-19 MED ORDER — LEVOTHYROXINE SODIUM 200 MCG PO TABS: 200.0000 ug | ORAL_TABLET | Freq: Every day | ORAL | 2 refills | Status: DC

## 2019-03-19 NOTE — Addendum Note (Signed)
Addended by: Delia Chimes A on: 03/19/2019 08:15 AM   Modules accepted: Orders

## 2019-03-19 NOTE — Addendum Note (Signed)
Addended by: Delia Chimes A on: 03/19/2019 08:17 AM   Modules accepted: Orders

## 2019-06-11 ENCOUNTER — Ambulatory Visit: Payer: BC Managed Care – PPO | Admitting: Neurology

## 2019-06-11 ENCOUNTER — Other Ambulatory Visit: Payer: Self-pay

## 2019-06-11 ENCOUNTER — Ambulatory Visit: Payer: BC Managed Care – PPO

## 2019-06-11 ENCOUNTER — Encounter: Payer: Self-pay | Admitting: Neurology

## 2019-06-11 VITALS — BP 158/94 | HR 101 | Ht 61.0 in | Wt 294.0 lb

## 2019-06-11 DIAGNOSIS — Z9989 Dependence on other enabling machines and devices: Secondary | ICD-10-CM

## 2019-06-11 DIAGNOSIS — G4733 Obstructive sleep apnea (adult) (pediatric): Secondary | ICD-10-CM | POA: Diagnosis not present

## 2019-06-11 NOTE — Patient Instructions (Signed)

## 2019-06-11 NOTE — Progress Notes (Signed)
Subjective:    Patient ID: Cassandra Barrera is a 48 y.o. female.  HPI     Interim history:   Cassandra Barrera is a 48 year old right-handed woman with an underlying medical history of anxiety, depression, hypothyroidism, allergies and morbid obesity with a BMI of over 89, who presents for follow-up consultation of her obstructive sleep apnea, well-established on CPAP therapy.  The patient is accompanied by her Mother-in-law today and presents for her yearly checkup.  I last saw her on 12/06/2017 after her sleep study testing and starting CPAP therapy.  She was compliant with treatment and doing well, felt much improved.  She saw Cecille Rubin, nurse practitioner in the interim on 06/08/2018, at which time she was compliant with treatment and advised to follow-up yearly.  Today, 06/11/2019: I reviewed her CPAP compliance data from 05/07/2019 through 06/05/2019 which is a total of 30 days, during which time she used her machine every night with percent use days greater than 4 hours at 100%, indicating superb compliance with an average usage of 7 hours and 24 minutes, residual AHI at goal at 0.7/h, leak low, pressure at 12 cm.  She reports that her CPAP is going well.  She uses nasal pillows, she is generally up-to-date with her supplies and sleeps well.  She works at [employer], her older daughter is at [employer] and her younger one during remote learning, sophomore in high school.  She denies any acute illness or new medications.  Her weight may have increased since the pandemic she estimates.    The patient's allergies, current medications, family history, past medical history, past social history, past surgical history and problem list were reviewed and updated as appropriate.    Previously:   I first met her on 08/04/2017 at the request of her primary care provider, at which time she reported snoring, witnessed apneas as well as daytime somnolence. I asked her to proceed with a sleep  study. She had a split-night sleep study on 09/26/2017. I went over her test results with her in detail today. Baseline sleep efficiency was 81.9% with a sleep latency of 22 minutes and REM sleep was absent. She had an increased percentage of light stage sleep and absence of slow-wave sleep and REM sleep. She had moderate to loud snoring, total AHI was highly elevated at 165.3 per hour in the absence of REM sleep achieved. Overall oxygen saturation was 96% and nadir was in the severe range at 66% with significant time below 89% saturation of over 90 minutes. She was titrated with CPAP from 5 cm to 12 cm. She was fitted with nasal pillows and on the final titration pressure her AHI was 0 per hour with non-supine REM sleep achieved an O2 nadir of 90%. She had no significant PLMS during the study. She had no significant EKG changes during the study. She had a significant amount of REM rebound during the titration portion of the study. Based on her test results I prescribed CPAP therapy for home use at a pressure of 12 cm.   I reviewed her CPAP compliance data from 11/05/2017 through 12/04/2017 which is a total of 30 days, during which time she used her machine every night with percent used days greater than 4 hours at 93.3%, indicating excellent compliance with an average usage of 7 hours and 7 minutes which is also very good, residual AHI at goal at 0.8 per hour, leak very low, pressure at 12 cm.    08/04/2017: (She)  reports snoring and excessive daytime somnolence as well as a family history of OSA. Her husband has witnessed apneas while she is asleep. I reviewed your office note from 07/28/2017. Her Epworth sleepiness score is 20 out of 24, fatigue score is 32 out of 63. She does not wake up rested. She wakes up with a sense of gasping for air. She lives with her husband and children. She has 2 daughters, ages 67 and 63. She works for [employer], part-time as a Licensed conveyancer, she also tutors math. She is  a nonsmoker and does not utilize alcohol and drinks caffeine in the form of soda, 2-3 per day.  She is usually in bed by 10 PM , she does not watch TV in bed. They do have a dog in the bedroom and sometimes on her bed. She has a wake time of around 7. She denies any telltale symptoms of restless leg syndrome or leg twitching at night. She has nocturia about once per night on average. She has come close to dozing off at work. She does not take any scheduled naps but has lately fallen asleep inadvertently when sedentary at home. She has gained some weight in the past few years. She has gradually become more sleepy during the day. Her father has obstructive sleep apnea and uses a CPAP machine.  Her Past Medical History Is Significant For: Past Medical History:  Diagnosis Date  . Anxiety   . Depression   . OSA on CPAP   . Thyroid disease    underactive    Her Past Surgical History Is Significant For: No past surgical history on file.  Her Family History Is Significant For: Family History  Problem Relation Age of Onset  . Hypertension Mother   . Diabetes Mother   . Heart disease Mother   . Diabetes Father     Her Social History Is Significant For: Social History   Socioeconomic History  . Marital status: Married    Spouse name: Jacqulyn Bath "Fara Olden"  . Number of children: 2  . Years of education: college  . Highest education level: Not on file  Occupational History  . 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 Financial resource strain: Not on file  . Food insecurity    Worry: Not on file    Inability: Not on file  . Transportation needs    Medical: Not on file    Non-medical: Not on file  Tobacco Use  . Smoking status: Never Smoker  . Smokeless tobacco: Never Used  Substance and Sexual Activity  . Alcohol use: No    Alcohol/week: 0.0 standard drinks  . Drug use: No  . Sexual activity: Yes    Birth control/protection: I.U.D.  Lifestyle   . Physical activity    Days per week: Not on file    Minutes per session: Not on file  . Stress: Not on file  Relationships  . Social Herbalist on phone: Not on file    Gets together: Not on file    Attends religious service: Not on file    Active member of club or organization: Not on file    Attends meetings of clubs or organizations: Not on file    Relationship status: Not on file  Other Topics Concern  . Not on file  Social History Narrative   Lives with her husband and their 2 daughters.   Parents live in [place].  Her sister and her husband's family live in [place].    Her Allergies Are:  Allergies  Allergen Reactions  . Sulfa Antibiotics Other (See Comments)    Reaction: lightheaded  :   Her Current Medications Are:  Outpatient Encounter Medications as of 06/11/2019  Medication Sig  . HYDROcodone-acetaminophen (NORCO/VICODIN) 5-325 MG tablet Take 1 tablet by mouth every 6 (six) hours as needed for severe pain.  Marland Kitchen levothyroxine (SYNTHROID) 200 MCG tablet Take 1 tablet (200 mcg total) by mouth daily before breakfast. Take with 72mg for a total of 2219mdaily.  . Marland Kitchenoratadine (CLARITIN) 10 MG tablet Take 1 tablet (10 mg total) by mouth daily.  . sertraline (ZOLOFT) 50 MG tablet Take 3 tablets (150 mg total) by mouth daily.   No facility-administered encounter medications on file as of 06/11/2019.   :  Review of Systems:  Out of a complete 14 point review of systems, all are reviewed and negative with the exception of these symptoms as listed below: Review of Systems  Neurological:       Pt presents today to discuss her cpap. She reports that it is going well.    Objective:  Neurological Exam  Physical Exam Physical Examination:   Vitals:   06/11/19 1508  BP: (!) 158/94  Pulse: (!) 101    General Examination: The patient is a very pleasant [age].o. female in no acute distress. She appears well-developed and well-nourished and well groomed.    HEENT:Normocephalic, atraumatic, pupils are equal, round and reactive to light. Extraocular tracking is good. Hearing is grossly intact. Face is symmetric with normal facial animation. speech is clear with no dysarthria noted. There is no hypophonia. There is no lip, neck/head, jaw or voice tremor. Neck is supple with full range of passive and active motion. Oropharynx exam reveals: mildmouth dryness, gooddental hygiene and moderateairway crowding.   Chest:Clear to auscultation without wheezing, rhonchi or crackles noted.  Heart:S1+S2+0, regular and normal without murmurs, rubs or gallops noted.   Abdomen:Soft, non-tender and non-distended.  Extremities:There isnon pitting puffiness in her feet and ankles, R more than L.   Skin: Warm and dry without trophic changes noted.  Musculoskeletal: exam reveals no obvious joint deformities, tenderness or joint swelling or erythema.   Neurologically:  Mental status: The patient is awake, alert and oriented in all 4 spheres.Herimmediate and remote memory, attention, language skills and fund of knowledge are appropriate. There is no evidence of aphasia, agnosia, apraxia or anomia. Speech is clear with normal prosody and enunciation. Thought process is linear. Mood is normaland affect is normal.  Cranial nerves II - XII are as described above under HEENT exam.  Motor exam: Normal bulk, strength and tone is noted. There is no drift, or tremor. Romberg is negative. Fine motor skills and coordination: grossly intact.  Cerebellar testing: No dysmetria or intention tremor. There is no truncal or gait ataxia.  Sensory exam: intact to light touchin the upper and lower extremities.  Gait, station and balance:Shestands easily. No veering to one side is noted. No leaning to one side is noted. Posture is age-appropriate and stance is narrow based. Gait showsnormalstride length and normalpace. No problems turning are noted.  Assessmentand  Plan:  In summary,Madelynn S Palmais a very pleasant [age]ear oldfemalewith an underlying medical history of anxiety, depression, hypothyroidism, allergies and morbid obesity with a BMI of over 5065who presents for follow-up consultation of her severe obstructive sleep apnea, Well-established on CPAP therapy of 12 cm.  She uses nasal  pillows with success.  She is fully compliant with treatment and is commended for her treatment adherence.  Of note, she had a split-night sleep study in January 2019. She sleeps well with her CPAP and endorses ongoing good results with respect to daytime energy, nighttime sleep consolidation and sleep quality.  She is encouraged to continue to work on weight loss.  She is advised to follow-up routinely in 1 year to see the nurse practitioner.  I answered all her questions today and she was in agreement.  I renewed her CPAP supply order today. I spent 15 minutes in total face-to-face time with the patient, more than 50% of which was spent in counseling and coordination of care, reviewing test results, reviewing medication and discussing or reviewing the diagnosis of OSA, its prognosis and treatment options. Pertinent laboratory and imaging test results that were available during this visit with the patient were reviewed by me and considered in my medical decision making (see chart for details).

## 2019-06-11 NOTE — Progress Notes (Signed)
Order for cpap supplies sent to [facility] via community message. Confirmation received that the order transmitted was successful.  

## 2019-06-12 ENCOUNTER — Other Ambulatory Visit: Payer: Self-pay

## 2019-06-12 ENCOUNTER — Ambulatory Visit: Payer: BC Managed Care – PPO

## 2019-06-12 DIAGNOSIS — E039 Hypothyroidism, unspecified: Secondary | ICD-10-CM

## 2019-06-13 LAB — TSH: TSH: 4.35 u[IU]/mL (ref 0.450–4.500)

## 2019-06-15 ENCOUNTER — Other Ambulatory Visit: Payer: Self-pay | Admitting: Family Medicine

## 2019-06-15 DIAGNOSIS — E039 Hypothyroidism, unspecified: Secondary | ICD-10-CM

## 2019-06-15 NOTE — Telephone Encounter (Signed)
Requested medication (s) are due for refill today: yes  Requested medication (s) are on the active medication list: yes  Last refill:  03/16/2019  Future visit scheduled: no  Notes to clinic:  Review for refill Medication was discontinued   Requested Prescriptions  Pending Prescriptions Disp Refills   levothyroxine (SYNTHROID) 25 MCG tablet [Pharmacy Med Name: LEVOTHYROXINE 0.025MG  (25MCG) TAB] 90 tablet 0    Sig: TAKE 1 TABLET BY MOUTH DAILY BEFORE BREAKFAST. TAKE WITH 200 MCG OF LEVOTHYROXINE FOR A TOAL DOSE OF 225 MCG.     Endocrinology:  Hypothyroid Agents Failed - 06/15/2019  9:01 AM      Failed - TSH needs to be rechecked within 3 months after an abnormal result. Refill until TSH is due.      Passed - TSH in normal range and within 360 days    TSH  Date Value Ref Range Status  06/12/2019 4.350 0.450 - 4.500 uIU/mL Final         Passed - Valid encounter within last 12 months    Recent Outpatient Visits          3 months ago Acquired hypothyroidism   [facility] at [facility], Arlie Solomons, MD   1 year ago Hypothyroidism, unspecified type   Primary Care at [facility], Gelene Mink, [name]   1 year ago Sleep apnea, unspecified type   Primary Care at [facility], [place], [place]   2 years ago Hypothyroidism, unspecified type   Primary Care at [place], [place], [place]   4 years ago Anxiety and depression   Primary Care at [place], [place], [place]

## 2019-09-11 ENCOUNTER — Other Ambulatory Visit: Payer: Self-pay

## 2019-09-11 ENCOUNTER — Encounter: Payer: Self-pay | Admitting: Family Medicine

## 2019-09-11 ENCOUNTER — Ambulatory Visit: Payer: BC Managed Care – PPO | Admitting: Family Medicine

## 2019-09-11 VITALS — BP 108/75 | HR 98 | Temp 97.8°F | Resp 17 | Ht 61.0 in | Wt 288.0 lb

## 2019-09-11 DIAGNOSIS — H8111 Benign paroxysmal vertigo, right ear: Secondary | ICD-10-CM

## 2019-09-11 DIAGNOSIS — E039 Hypothyroidism, unspecified: Secondary | ICD-10-CM | POA: Diagnosis not present

## 2019-09-11 DIAGNOSIS — H6993 Unspecified Eustachian tube disorder, bilateral: Secondary | ICD-10-CM

## 2019-09-11 DIAGNOSIS — Z23 Encounter for immunization: Secondary | ICD-10-CM

## 2019-09-11 DIAGNOSIS — Z6841 Body Mass Index (BMI) 40.0 and over, adult: Secondary | ICD-10-CM | POA: Diagnosis not present

## 2019-09-11 LAB — POCT GLYCOSYLATED HEMOGLOBIN (HGB A1C): Hemoglobin A1C: 5.5 % (ref 4.0–5.6)

## 2019-09-11 MED ORDER — MECLIZINE HCL 25 MG PO TABS: 25.0000 mg | ORAL_TABLET | Freq: Three times a day (TID) | ORAL | 0 refills | Status: DC | PRN

## 2019-09-11 MED ORDER — FLUTICASONE PROPIONATE 50 MCG/ACT NA SUSP: 2.0000 | Freq: Every day | NASAL | 6 refills | Status: DC

## 2019-09-11 NOTE — Progress Notes (Signed)
Established Patient Office Visit  Subjective:  Patient ID: Cassandra Barrera, female    DOB: 03/01/1971  Age: 48 y.o. MRN: [id number]  CC:  Chief Complaint  Patient presents with  . Hypertension  . Dizziness    onset: saturday 09/08/2019, bending over triggers the vertigo, Initially started on right side of body per pt she would have to sleep on left side.  Per pt yesterday she felt better and got up and started doing some things but when she sat down the vertigo started again just from sitting    HPI Cassandra LarsenStephanie S Thall presents for   Onset: 09/08/2019 Pt was on her right side in bed when she rolled over and the room started spinning When she got up to sit up the room started spinning She got nauseous  She tried some exercise which did not help She took a nap and then it went away She reports that since then she cannot bed her head down or lay on her right side.  She feels like her right ear is congested.  On 09/10/19. She sat on the couch and got dizzy She went to lie back down in bed and the dizziness resolved.   She denies current dizziness  She denies nausea currently She denies tinnitus  She denies blurry vision  She denies new medications, denies increase alcohol.  She has hypothyroidism and is taking levothyroxine 200mcg and has not missed any dosese.   She is still taking the zoloft 150mg  and has not missed any doses .  Lab Results  Component Value Date   TSH 4.350 06/12/2019   Morbid Obesity She is cutting back on her calories  She is not exercising She works as a Clinical biochemistmath tutor Wt Readings from Last 3 Encounters:  09/11/19 288 lb (130.6 kg)  06/11/19 294 lb (133.4 kg)  03/13/19 292 lb (132.5 kg)     Past Medical History:  Diagnosis Date  . Anxiety   . Depression   . OSA on CPAP   . Thyroid disease    underactive    No past surgical history on file.  Family History  Problem Relation Age of Onset  . Hypertension Mother   . Diabetes Mother   .  Heart disease Mother   . Diabetes Father     Social History   Socioeconomic History  . Marital status: Married    Spouse name: Elita QuickJose "Francis DowseJoel"  . Number of children: 2  . Years of education: college  . Highest education level: Not on file  Occupational History  . Occupation: part-time Designer, multimediamath tutor    Comment: [employer]  Tobacco Use  . Smoking status: Never Smoker  . Smokeless tobacco: Never Used  Substance and Sexual Activity  . Alcohol use: No    Alcohol/week: 0.0 standard drinks  . Drug use: No  . Sexual activity: Yes    Birth control/protection: I.U.D.  Other Topics Concern  . Not on file  Social History Narrative   Lives with her husband and their 2 daughters.   Parents live in [place].   Her sister and her husband's family [place] in [place].   Social Determinants of Health   Financial Resource Strain:   . Difficulty of Paying Living Expenses: Not on file  Food Insecurity:   . Worried About Programme researcher, broadcasting/film/videounning Out of Food in the Last Year: Not on file  . Ran Out of Food in the Last Year: Not on file  Transportation Needs:   . Lack  of Transportation (Medical): Not on file  . Lack of Transportation (Non-Medical): Not on file  Physical Activity:   . Days of Exercise per Week: Not on file  . Minutes of Exercise per Session: Not on file  Stress:   . Feeling of Stress : Not on file  Social Connections:   . Frequency of Communication with Friends and Family: Not on file  . Frequency of Social Gatherings with Friends and Family: Not on file  . Attends Religious Services: Not on file  . Active Member of Clubs or Organizations: Not on file  . Attends Banker Meetings: Not on file  . Marital Status: Not on file  Intimate Partner Violence:   . Fear of Current or Ex-Partner: Not on file  . Emotionally Abused: Not on file  . Physically Abused: Not on file  . Sexually Abused: Not on file    Outpatient Medications Prior to Visit  Medication Sig Dispense  Refill  . levothyroxine (SYNTHROID) 200 MCG tablet Take 1 tablet (200 mcg total) by mouth daily before breakfast. Take with for a total of 225mg  daily. 90 tablet 2  . loratadine (CLARITIN) 10 MG tablet Take 1 tablet (10 mg total) by mouth daily. 30 tablet 11  . HYDROcodone-acetaminophen (NORCO/VICODIN) 5-325 MG tablet Take 1 tablet by mouth every 6 (six) hours as needed for severe pain. (Patient not taking: Reported on 09/11/2019) 8 tablet 0  . levothyroxine (SYNTHROID) 25 MCG tablet TAKE 1 TABLET BY MOUTH DAILY BEFORE BREAKFAST. TAKE WITH 200 MCG OF LEVOTHYROXINE FOR A TOAL DOSE OF 225 MCG. (Patient not taking: Reported on 09/11/2019) 90 tablet 0  . sertraline (ZOLOFT) 50 MG tablet Take 3 tablets (150 mg total) by mouth daily. 270 tablet 3   No facility-administered medications prior to visit.    Allergies  Allergen Reactions  . Sulfa Antibiotics Other (See Comments)    Reaction: lightheaded    ROS Review of Systems Review of Systems  Constitutional: Negative for activity change, appetite change, chills and fever.  HENT: Negative for congestion, nosebleeds, trouble swallowing and voice change.   Respiratory: Negative for cough, shortness of breath and wheezing.   Gastrointestinal: Negative for diarrhea, nausea and vomiting.  Genitourinary: Negative for difficulty urinating, dysuria, flank pain and hematuria.  Musculoskeletal: Negative for back pain, joint swelling and neck pain.  Neurological: ++dizziness, no speech difficulty, light-headedness and numbness.  See HPI. All other review of systems negative.     Objective:    Physical Exam  BP 108/75 (BP Location: Left Arm, Patient Position: Sitting, Cuff Size: Large)   Pulse 98   Temp 97.8 F (36.6 C) (Oral)   Resp 17   Ht 5\' 1"  (1.549 m)   Wt 288 lb (130.6 kg)   SpO2 100%   BMI 54.42 kg/m  Wt Readings from Last 3 Encounters:  09/11/19 288 lb (130.6 kg)  06/11/19 294 lb (133.4 kg)  03/13/19 292 lb (132.5 kg)    Physical Exam  Constitutional: Oriented to person, place, and time. Appears well-developed and well-nourished.  HENT:  Head: Normocephalic and atraumatic.  Eyes: Conjunctivae and EOM are normal.  Cardiovascular: Normal rate, regular rhythm, normal heart sounds and intact distal pulses.  No murmur heard. Pulmonary/Chest: Effort normal and breath sounds normal. No stridor. No respiratory distress. Has no wheezes.  Neurological: Is alert and oriented to person, place, and time.  Skin: Skin is warm. Capillary refill takes less than 2 seconds. Very dry skin Psychiatric: Has a normal  mood and affect. Behavior is normal. Judgment and thought content normal.   CRANIAL NERVES: CN II: Visual fields are full to confrontation. Fundoscopic exam is normal with sharp discs and no vascular changes. Pupils are round equal and briskly reactive to light. CN III, IV, VI: extraocular movement are normal. No ptosis. CN V: Facial sensation is intact to pinprick in all 3 divisions bilaterally. Corneal responses are intact.  CN VII: Face is symmetric with normal eye closure and smile. CN VIII: Hearing is normal to rubbing fingers CN IX, X: Palate elevates symmetrically. Phonation is normal. CN XI: Head turning and shoulder shrug are intact CN XII: Tongue is midline with normal movements and no atrophy.   Health Maintenance Due  Topic Date Due  . PAP SMEAR-Modifier  09/27/1993  . TETANUS/TDAP  01/08/2019  . INFLUENZA VACCINE  04/21/2019    There are no preventive care reminders to display for this patient.  Lab Results  Component Value Date   TSH 4.350 06/12/2019   Lab Results  Component Value Date   WBC 11.9 (H) 03/13/2019   HGB 13.7 03/13/2019   HCT 42.3 03/13/2019   MCV 89 03/13/2019   PLT 385 03/13/2019   Lab Results  Component Value Date   NA 140 07/28/2017   K 4.5 07/28/2017   CO2 25 07/28/2017   GLUCOSE 76 07/28/2017   BUN 14 07/28/2017   CREATININE 0.66 07/28/2017   BILITOT 0.2  07/28/2017   ALKPHOS 62 07/28/2017   AST 16 07/28/2017   ALT 13 07/28/2017   PROT 7.1 07/28/2017   ALBUMIN 4.2 07/28/2017   CALCIUM 9.4 07/28/2017   Lab Results  Component Value Date   CHOL 163 03/13/2019   Lab Results  Component Value Date   HDL 34 (L) 03/13/2019   Lab Results  Component Value Date   LDLCALC 79 03/13/2019   Lab Results  Component Value Date   TRIG 250 (H) 03/13/2019   Lab Results  Component Value Date   CHOLHDL 4.8 (H) 03/13/2019   No results found for: HGBA1C    Assessment & Plan:   Problem List Items Addressed This Visit      Endocrine   Hypothyroidism - Primary Last tsh stable but pt with dizziness and very dry skin Will recheck    Relevant Orders   TSH     Other   Morbid obesity with BMI of 50.0-59.9, adult ([place])   Relevant Orders   POCT glycosylated hemoglobin (Hb A1C)    Other Visit Diagnoses    Benign paroxysmal positional vertigo of right ear    -  Referral placed to Vestibular Rehab Will check for any deficiencies   Relevant Orders   TSH   POCT glycosylated hemoglobin (Hb K1S)   Basic metabolic panel   Ambulatory referral to Physical Therapy   Need for prophylactic vaccination and inoculation against influenza       Relevant Orders   Influenza (Seasonal)   Disorder of both eustachian tubes    -  Discussed her symptoms and will try meclizine and flonase   Relevant Medications   fluticasone (FLONASE) 50 MCG/ACT nasal spray      Meds ordered this encounter  Medications  . meclizine (ANTIVERT) 25 MG tablet    Sig: Take 1 tablet (25 mg total) by mouth 3 (three) times daily as needed for dizziness.    Dispense:  30 tablet    Refill:  0  . fluticasone (FLONASE) 50 MCG/ACT nasal spray  Sig: Place 2 sprays into both nostrils daily.    Dispense:  16 g    Refill:  6    Follow-up: No follow-ups on file.    Doristine Bosworth, MD

## 2019-09-11 NOTE — Patient Instructions (Addendum)
If you have lab work done today you will be contacted with your lab results within the next 2 weeks.  If you have not heard from [place] then please contact us. The fastest way to get your results is to register for My Chart.   IF you received an x-ray today, you will receive an invoice from [facility]. Please contact [facility] at [phone] with questions or concerns regarding your invoice.   IF you received labwork today, you will receive an invoice from [place]. Please contact [facility] at [phone] with questions or concerns regarding your invoice.   Our billing staff will not be able to assist you with questions regarding bills from these companies.  You will be contacted with the lab results as soon as they are available. The fastest way to get your results is to activate your My Chart account. Instructions are located on the last page of this paperwork. If you have not heard from [place] regarding the results in 2 weeks, please contact this office.     Benign Positional Vertigo Vertigo is the feeling that you or your surroundings are moving when they are not. Benign positional vertigo is the most common form of vertigo. This is usually a harmless condition (benign). This condition is positional. This means that symptoms are triggered by certain movements and positions. This condition can be dangerous if it occurs while you are doing something that could cause harm to you or others. This includes activities such as driving or operating machinery. What are the causes? In many cases, the cause of this condition is not known. It may be caused by a disturbance in an area of the inner ear that helps your brain to sense movement and balance. This disturbance can be caused by:  Viral infection (labyrinthitis).  Head injury.  Repetitive motion, such as jumping, dancing, or running. What increases the risk? You are more likely to develop this condition if:  You are a  woman.  You are 44 years of age or older. What are the signs or symptoms? Symptoms of this condition usually happen when you move your head or your eyes in different directions. Symptoms may start suddenly, and usually last for less than a minute. They include:  Loss of balance and falling.  Feeling like you are spinning or moving.  Feeling like your surroundings are spinning or moving.  Nausea and vomiting.  Blurred vision.  Dizziness.  Involuntary eye movement (nystagmus). Symptoms can be mild and cause only minor problems, or they can be severe and interfere with daily life. Episodes of benign positional vertigo may return (recur) over time. Symptoms may improve over time. How is this diagnosed? This condition may be diagnosed based on:  Your medical history.  Physical exam of the head, neck, and ears.  Tests, such as: ? MRI. ? CT scan. ? Eye movement tests. Your health care provider may ask you to change positions quickly while he or she watches you for symptoms of benign positional vertigo, such as nystagmus. Eye movement may be tested with a variety of exams that are designed to evaluate or stimulate vertigo. ? An electroencephalogram (EEG). This records electrical activity in your brain. ? Hearing tests. You may be referred to a health care provider who specializes in ear, nose, and throat (ENT) problems (otolaryngologist) or a provider who specializes in disorders of the nervous system (neurologist). How is this treated?  This condition may be treated in a session in which your  health care provider moves your head in specific positions to adjust your inner ear back to normal. Treatment for this condition may take several sessions. Surgery may be needed in severe cases, but this is rare. In some cases, benign positional vertigo may resolve on its own in 2-4 weeks. Follow these instructions at home: Safety  Move slowly. Avoid sudden body or head movements or certain  positions, as told by your health care provider.  Avoid driving until your health care provider says it is safe for you to do so.  Avoid operating heavy machinery until your health care provider says it is safe for you to do so.  Avoid doing any tasks that would be dangerous to you or others if vertigo occurs.  If you have trouble walking or keeping your balance, try using a cane for stability. If you feel dizzy or unstable, sit down right away.  Return to your normal activities as told by your health care provider. Ask your health care provider what activities are safe for you. General instructions  Take over-the-counter and prescription medicines only as told by your health care provider.  Drink enough fluid to keep your urine pale yellow.  Keep all follow-up visits as told by your health care provider. This is important. Contact a health care provider if:  You have a fever.  Your condition gets worse or you develop new symptoms.  Your family or friends notice any behavioral changes.  You have nausea or vomiting that gets worse.  You have numbness or a "pins and needles" sensation. Get help right away if you:  Have difficulty speaking or moving.  Are always dizzy.  Faint.  Develop severe headaches.  Have weakness in your legs or arms.  Have changes in your hearing or vision.  Develop a stiff neck.  Develop sensitivity to light. Summary  Vertigo is the feeling that you or your surroundings are moving when they are not. Benign positional vertigo is the most common form of vertigo.  The cause of this condition is not known. It may be caused by a disturbance in an area of the inner ear that helps your brain to sense movement and balance.  Symptoms include loss of balance and falling, feeling that you or your surroundings are moving, nausea and vomiting, and blurred vision.  This condition can be diagnosed based on symptoms, physical exam, and other tests, such as  MRI, CT scan, eye movement tests, and hearing tests.  Follow safety instructions as told by your health care provider. You will also be told when to contact your health care provider in case of problems. This information is not intended to replace advice given to you by your health care provider. Make sure you discuss any questions you have with your health care provider. Document Released: 06/14/2006 Document Revised: 02/15/2018 Document Reviewed: 02/15/2018 Elsevier Patient Education  2020 [employer].

## 2019-09-12 LAB — BASIC METABOLIC PANEL
BUN/Creatinine Ratio: 27 — ABNORMAL HIGH (ref 9–23)
BUN/Creatinine Ratio: 22 (ref 9–23)
BUN: 13 mg/dL (ref 6–24)
BUN: 14 mg/dL (ref 6–24)
CO2: 23 mmol/L (ref 20–29)
CO2: 22 mmol/L (ref 20–29)
Calcium: 9.4 mg/dL (ref 8.7–10.2)
Calcium: 9.6 mg/dL (ref 8.7–10.2)
Chloride: 100 mmol/L (ref 96–106)
Chloride: 101 mmol/L (ref 96–106)
Creatinine, Ser: 0.48 mg/dL — ABNORMAL LOW (ref 0.57–1.00)
Creatinine, Ser: 0.64 mg/dL (ref 0.57–1.00)
GFR calc Af Amer: 136 mL/min/{1.73_m2} (ref >59)
GFR calc Af Amer: 124 mL/min/{1.73_m2} (ref >59)
GFR calc non Af Amer: 118 mL/min/{1.73_m2} (ref >59)
GFR calc non Af Amer: 107 mL/min/{1.73_m2} (ref >59)
Glucose: 97 mg/dL (ref 65–99)
Glucose: 96 mg/dL (ref 65–99)
Potassium: 4.3 mmol/L (ref 3.5–5.2)
Potassium: 4.3 mmol/L (ref 3.5–5.2)
Sodium: 138 mmol/L (ref 134–144)
Sodium: 139 mmol/L (ref 134–144)

## 2019-09-12 LAB — TSH: TSH: 10.1 u[IU]/mL — ABNORMAL HIGH (ref 0.450–4.500)

## 2019-09-16 ENCOUNTER — Encounter: Payer: Self-pay | Admitting: Family Medicine

## 2019-09-16 ENCOUNTER — Other Ambulatory Visit: Payer: Self-pay | Admitting: Family Medicine

## 2019-09-16 DIAGNOSIS — E039 Hypothyroidism, unspecified: Secondary | ICD-10-CM

## 2019-09-16 MED ORDER — LEVOTHYROXINE SODIUM 200 MCG PO TABS: 200.0000 ug | ORAL_TABLET | Freq: Every day | ORAL | 1 refills | Status: DC

## 2019-09-16 MED ORDER — LEVOTHYROXINE SODIUM 50 MCG PO TABS: 50.0000 ug | ORAL_TABLET | Freq: Every day | ORAL | 1 refills | Status: DC

## 2019-09-16 NOTE — Progress Notes (Signed)
Answering service call. 2:21pm. Pharmacy clarification. Levothyroxine 200 and 49mcg but sig on 200 indicates total 225 mcg. Recent tsh elevation noted. Higher dose of 221mcg authorized to pharmacist at [place]

## 2019-09-28 ENCOUNTER — Encounter: Payer: Self-pay | Admitting: Physical Therapy

## 2019-09-28 ENCOUNTER — Other Ambulatory Visit: Payer: Self-pay

## 2019-09-28 ENCOUNTER — Ambulatory Visit: Payer: BC Managed Care – PPO | Attending: Family Medicine | Admitting: Physical Therapy

## 2019-09-28 DIAGNOSIS — R42 Dizziness and giddiness: Secondary | ICD-10-CM | POA: Diagnosis present

## 2019-09-28 DIAGNOSIS — H8111 Benign paroxysmal vertigo, right ear: Secondary | ICD-10-CM

## 2019-09-28 NOTE — Therapy (Signed)
[facility] [place] [facility] [address] [place], [place], [address] Phone: [phone]   Fax:  [phone]  Physical Therapy Evaluation  Patient Details  Name: Cassandra Barrera MRN: [id number] Date of Birth: Nov 12, 1970 Referring Provider (PT): Creta Levin   Encounter Date: 09/28/2019  PT End of Session - 09/28/19 0924    Visit Number  1    Date for PT Re-Evaluation  11/26/19    PT Start Time  0847    PT Stop Time  0925    PT Time Calculation (min)  38 min    Activity Tolerance  Patient tolerated treatment well    Behavior During Therapy  Anxious       Past Medical History:  Diagnosis Date  . Anxiety   . Depression   . OSA on CPAP   . Thyroid disease    underactive    History reviewed. No pertinent surgical history.  There were no vitals filed for this visit.   Subjective Assessment - 09/28/19 0858    Subjective  Patient reports that the Saturday before Christmas, she woke up on right side and rolled to her back and the room started spinning, she reports that lying on her left side there was no issue.  Reports that lying on back will increase the dizziness    Patient Stated Goals  not be dizzy    Currently in Pain?  No/denies         [facility] PT Assessment - 09/28/19 0001      Assessment   Medical Diagnosis  BPPV    Referring Provider (PT)  Stallings    Onset Date/Surgical Date  09/14/19    Prior Therapy  no      Precautions   Precautions  None      Balance Screen   Has the patient fallen in the past 6 months  No    Has the patient had a decrease in activity level because of a fear of falling?   No    Is the patient reluctant to leave their home because of a fear of falling?   No      Home Environment   Additional Comments  does housework      Prior Function   Level of Independence  Independent    Vocation  Part time employment    Vocation Requirements  mostly on computer    Leisure  no exercise      Posture/Postural Control   Posture Comments  fwd head and rounded shoulder           Vestibular Assessment - 09/28/19 0001      Symptom Behavior   Subjective history of current problem  2 weeks ago while rolling over    Type of Dizziness   Spinning;"Funny feeling in head"    Frequency of Dizziness  with turning head to right and extension    Duration of Dizziness  minutes    Symptom Nature  Positional    Aggravating Factors  Turning head quickly;Rolling to left    Relieving Factors  Head stationary    Progression of Symptoms  Better    History of similar episodes  no      Oculomotor Exam   Oculomotor Alignment  Normal          Objective measurements completed on examination: See above findings.              PT Education - 09/28/19 6962  Education Details  Gave info on [employer], gave after care instruction, went over wtih her and partner how to do Epley, demonstrated 3 x and really explained the process    Person(s) Educated  Patient;Spouse    Methods  Explanation;Demonstration;Verbal cues;Handout    Comprehension  Verbalized understanding          PT Long Term Goals - 09/28/19 0927      PT LONG TERM GOAL #1   Title  understand the epley manuever and BPPV    Time  2    Period  Weeks    Status  Achieved             Plan - 09/28/19 0925    Clinical Impression Statement  Patient describes a pretty much text book case of right side BPPV, she reports that she has been avoiding head motions into extension and to the right and has avoided sleeping on her right side and she has not had symptoms doing this, today is her birthday and she did not want to try the epley manuever, I went over with her and partner how to do and what to expect and really explained the process, they felt good about doing it in the future    Stability/Clinical Decision Making  Stable/Uncomplicated    Clinical Decision Making  Low    Rehab Potential  Good    PT Frequency   1x / week    PT Duration  8 weeks    PT Treatment/Interventions  ADLs/Self Care Home Management;Vestibular;Canalith Repostioning    PT Next Visit Plan  Patient is going to try the epley in the future, if she has issues she may return for me to perform    Consulted and Agree with Plan of Care  Patient       Patient will benefit from skilled therapeutic intervention in order to improve the following deficits and impairments:  Dizziness  Visit Diagnosis: BPPV (benign paroxysmal positional vertigo), right - Plan: PT plan of care cert/re-cert  Dizziness and giddiness - Plan: PT plan of care cert/re-cert     Problem List Patient Active Problem List   Diagnosis Date Noted  . Morbid obesity with BMI of 50.0-59.9, adult ([place]) 03/13/2019  . Obstructive sleep apnea on CPAP 06/08/2018  . Sleep apnea 07/28/2017  . Hypothyroidism 03/06/2013  . Anxiety and depression 03/06/2013    Sumner Boast., PT 09/28/2019, 9:29 AM  [place] [address] [place], [place], [address] Phone: [phone]   Fax:  [phone]  Name: Cassandra Barrera MRN: [id number] Date of Birth: 30-May-1971

## 2019-12-11 ENCOUNTER — Other Ambulatory Visit: Payer: Self-pay

## 2019-12-11 ENCOUNTER — Telehealth: Payer: Self-pay | Admitting: Family Medicine

## 2019-12-11 ENCOUNTER — Telehealth (INDEPENDENT_AMBULATORY_CARE_PROVIDER_SITE_OTHER): Payer: BC Managed Care – PPO | Admitting: Family Medicine

## 2019-12-11 DIAGNOSIS — E039 Hypothyroidism, unspecified: Secondary | ICD-10-CM

## 2019-12-11 DIAGNOSIS — Z23 Encounter for immunization: Secondary | ICD-10-CM | POA: Diagnosis not present

## 2019-12-11 DIAGNOSIS — Z13228 Encounter for screening for other metabolic disorders: Secondary | ICD-10-CM | POA: Diagnosis not present

## 2019-12-11 DIAGNOSIS — Z6841 Body Mass Index (BMI) 40.0 and over, adult: Secondary | ICD-10-CM

## 2019-12-11 NOTE — Progress Notes (Signed)
Cassandra Barrera Encounter- SOAP NOTE Established Patient  This Barrera encounter was conducted with the patient's (or proxy's) verbal consent via audio telecommunications: yes/no: Yes Patient was instructed to have this encounter in a suitably private space; and to only have persons present to whom they give permission to participate. In addition, patient identity was confirmed by use of name plus two identifiers (DOB and address).  I discussed the limitations, risks, security and privacy concerns of performing an evaluation and management service by telephone and the availability of in person appointments. I also discussed with the patient that there may be a patient responsible charge related to this service. The patient expressed understanding and agreed to proceed.  I spent a total of TIME; 0 MIN TO 60 MIN: 15 minutes talking with the patient or their proxy.  CC: THYROID CHECK   Subjective   Cassandra Barrera is a 49 y.o. established patient. Telephone visit today for  HPI  Hypothyroidism: Patient presents for evaluation of thyroid function. Symptoms : denies fatigue, weight changes, heat/cold intolerance, bowel/skin changes or CVS symptoms.  Previous thyroid studies include TSH. The hypothyroidism is due to hypothyroidism. She reports that she is taking levothyroxine daily. Lab Results  Component Value Date   TSH 10.100 (H) 09/11/2019    Obesity Wt Readings from Last 3 Encounters:  09/11/19 288 lb (130.6 kg)  06/11/19 294 lb (133.4 kg)  03/13/19 292 lb (132.5 kg)   She is working on exercise and does 2-3 times a week. She states that she does not weigh herself She does not feel like her clothes are getting looser  Patient Active Problem List   Diagnosis Date Noted  . Morbid obesity with BMI of 50.0-59.9, adult ([facility]) 03/13/2019  . Obstructive sleep apnea on CPAP 06/08/2018  . Sleep apnea 07/28/2017  . Hypothyroidism 03/06/2013  . Anxiety and depression 03/06/2013     Past Medical History:  Diagnosis Date  . Anxiety   . Depression   . OSA on CPAP   . Thyroid disease    underactive    Current Outpatient Medications  Medication Sig Dispense Refill  . levothyroxine (SYNTHROID) 200 MCG tablet Take 1 tablet (200 mcg total) by mouth daily before breakfast. Take with for a total of 225mg  daily. 90 tablet 1  . levothyroxine (SYNTHROID) 50 MCG tablet Take 1 tablet (50 mcg total) by mouth daily before breakfast. 90 tablet 1  . loratadine (CLARITIN) 10 MG tablet Take 1 tablet (10 mg total) by mouth daily. 30 tablet 11  . sertraline (ZOLOFT) 50 MG tablet Take 3 tablets (150 mg total) by mouth daily. 270 tablet 3  . fluticasone (FLONASE) 50 MCG/ACT nasal spray Place 2 sprays into both nostrils daily. (Patient not taking: Reported on 12/11/2019) 16 g 6  . HYDROcodone-acetaminophen (NORCO/VICODIN) 5-325 MG tablet Take 1 tablet by mouth every 6 (six) hours as needed for severe pain. (Patient not taking: Reported on 09/11/2019) 8 tablet 0  . meclizine (ANTIVERT) 25 MG tablet Take 1 tablet (25 mg total) by mouth 3 (three) times daily as needed for dizziness. (Patient not taking: Reported on 12/11/2019) 30 tablet 0   No current facility-administered medications for this visit.    Allergies  Allergen Reactions  . Sulfa Antibiotics Other (See Comments)    Reaction: lightheaded    Social History   Socioeconomic History  . Marital status: Married    Spouse name: 12/13/2019 "Elita Quick"  . Number of children: 2  . Years of  education: college  . Highest education level: Not on file  Occupational History  . 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 Smoking status: Never Smoker  . Smokeless tobacco: Never Used  Substance and Sexual Activity  . Alcohol use: No    Alcohol/week: 0.0 standard drinks  . Drug use: No  . Sexual activity: Yes    Birth control/protection: I.U.D.  Other Topics Concern  . Not on file  Social  History Narrative   Lives with her husband and their 2 daughters.   Parents live in [place].   Her sister and her husband's family live in [place].   Social Determinants of Health   Financial Resource Strain:   . Difficulty of Paying Living Expenses:   Food Insecurity:   . Worried About Charity fundraiser in the Last Year:   . Arboriculturist in the Last Year:   Transportation Needs:   . Film/Barrera editor (Medical):   Marland Kitchen Lack of Transportation (Non-Medical):   Physical Activity:   . Days of Exercise per Week:   . Minutes of Exercise per Session:   Stress:   . Feeling of Stress :   Social Connections:   . Frequency of Communication with Friends and Family:   . Frequency of Social Gatherings with Friends and Family:   . Attends Religious Services:   . Active Member of Clubs or Organizations:   . Attends Archivist Meetings:   Marland Kitchen Marital Status:   Intimate Partner Violence:   . Fear of Current or Ex-Partner:   . Emotionally Abused:   Marland Kitchen Physically Abused:   . Sexually Abused:     ROS  Objective   Vitals as reported by the patient: There were no vitals filed for this visit.   Physical Exam  Constitutional: Oriented to person, place, and time. Appears well-developed and well-nourished. Obese appearing. HENT:  Head: Normocephalic and atraumatic.  Eyes: Conjunctivae and EOM are normal.  Pulmonary/Chest: Effort normal. No respiratory distress.  Neurological: Is alert and oriented to person, place, and time.  Skin: Skin is warm. Capillary refill takes less than 2 seconds.  Psychiatric: Has a normal mood and affect. Behavior is normal. Judgment and thought content normal.    Diagnoses and all orders for this visit:  Acquired hypothyroidism - discussed adjusting the hypothyroidism -     TSH; Future  Morbid obesity with BMI of 50.0-59.9, adult ([place])-  Discussed weight management, pt is motivated -     Lipid panel; Future -     Comprehensive metabolic  panel; Future  Encounter for screening for other metabolic disorders -     Comprehensive metabolic panel; Future  Need for Tdap vaccination -     Tdap vaccine greater than or equal to 7yo IM; Future     I discussed the assessment and treatment plan with the patient. The patient was provided an opportunity to ask questions and all were answered. The patient agreed with the plan and demonstrated an understanding of the instructions.   The patient was advised to call back or seek an in-person evaluation if the symptoms worsen or if the condition fails to improve as anticipated.  I provided 15 minutes of non-face-to-face time during this encounter.  Forrest Moron, MD  Primary Care at [facility]

## 2019-12-11 NOTE — Patient Instructions (Signed)
° ° ° °  If you have lab work done today you will be contacted with your lab results within the next 2 weeks.  If you have not heard from us then please contact us. The fastest way to get your results is to register for My Chart. ° ° °IF you received an x-ray today, you will receive an invoice from [facility]. Please contact [facility] at [phone] with questions or concerns regarding your invoice.  ° °IF you received labwork today, you will receive an invoice from [facility]. Please contact [facility] at [phone] with questions or concerns regarding your invoice.  ° °Our billing staff will not be able to assist you with questions regarding bills from these companies. ° °You will be contacted with the lab results as soon as they are available. The fastest way to get your results is to activate your My Chart account. Instructions are located on the last page of this paperwork. If you have not heard from us regarding the results in 2 weeks, please contact this office. °  ° ° ° °

## 2019-12-11 NOTE — Progress Notes (Signed)
Lvmtcb and schedule nurse visit

## 2020-05-08 ENCOUNTER — Other Ambulatory Visit: Payer: Self-pay

## 2020-05-08 DIAGNOSIS — E039 Hypothyroidism, unspecified: Secondary | ICD-10-CM

## 2020-05-08 MED ORDER — LEVOTHYROXINE SODIUM 50 MCG PO TABS: 50.0000 ug | ORAL_TABLET | Freq: Every day | ORAL | 0 refills | Status: DC

## 2020-05-08 MED ORDER — LEVOTHYROXINE SODIUM 200 MCG PO TABS: 200.0000 ug | ORAL_TABLET | Freq: Every day | ORAL | 0 refills | Status: DC

## 2020-05-08 NOTE — Telephone Encounter (Signed)
Medication Refill - Medication: levothyroxine (SYNTHROID) 200 MCG tablet  levothyroxine (SYNTHROID) 50 MCG tablet    Has the patient contacted their pharmacy? Yes.   (Agent: If no, request that the patient contact the pharmacy for the refill.) (Agent: If yes, when and what did the pharmacy advise?)request sent to office with no response   Preferred Pharmacy (with phone number or street name):  [id number] [place], [place] - [address] AT [address] Phone:  [phone]  Fax:  [phone]       Agent: Please be advised that RX refills may take up to 3 business days. We ask that you follow-up with your pharmacy.

## 2020-06-04 ENCOUNTER — Other Ambulatory Visit: Payer: Self-pay | Admitting: Family Medicine

## 2020-06-04 DIAGNOSIS — F419 Anxiety disorder, unspecified: Secondary | ICD-10-CM

## 2020-06-04 DIAGNOSIS — F32A Depression, unspecified: Secondary | ICD-10-CM

## 2020-06-04 NOTE — Telephone Encounter (Signed)
Requested medication (s) are due for refill today: yes  Requested medication (s) are on the active medication list: yes   Last refill:  start: 03/13/2019 end: 12/11/19 #270 3 refills  Future visit scheduled: yes in 2 weeks  Notes to clinic:  med expired 12/11/19     Requested Prescriptions  Pending Prescriptions Disp Refills   sertraline (ZOLOFT) 50 MG tablet 270 tablet 3    Sig: Take 3 tablets (150 mg total) by mouth daily.      Psychiatry:  Antidepressants - SSRI Passed - 06/04/2020  4:35 PM      Passed - Completed PHQ-2 or PHQ-9 in the last 360 days.      Passed - Valid encounter within last 6 months    Recent Outpatient Visits           5 months ago Acquired hypothyroidism   Primary Care at [facility], Manus Rudd, MD   8 months ago Acquired hypothyroidism   Primary Care at [facility], Manus Rudd, MD   1 year ago Acquired hypothyroidism   Primary Care at [facility], Manus Rudd, MD   2 years ago Hypothyroidism, unspecified type   Primary Care at [facility], Madelaine Bhat, [name]   2 years ago Sleep apnea, unspecified type   Primary Care at [facility], [place], [place]       Future Appointments             In 2 weeks Janeece Agee, NP Primary Care at [place], [facility]

## 2020-06-04 NOTE — Telephone Encounter (Signed)
Medication Refill - Medication: Zoloft   Has the patient contacted their pharmacy? Yes.   (Agent: If no, request that the patient contact the pharmacy for the refill.) (Agent: If yes, when and what did the pharmacy advise?)  Preferred Pharmacy (with phone number or street name): [id number] - [place], [place] - [address]  Agent: Please be advised that RX refills may take up to 3 business days. We ask that you follow-up with your pharmacy.

## 2020-06-05 MED ORDER — SERTRALINE HCL 50 MG PO TABS: 150.0000 mg | ORAL_TABLET | Freq: Every day | ORAL | 3 refills | Status: DC

## 2020-06-10 ENCOUNTER — Ambulatory Visit: Payer: BC Managed Care – PPO | Admitting: Family Medicine

## 2020-06-10 ENCOUNTER — Encounter: Payer: Self-pay | Admitting: Family Medicine

## 2020-06-10 VITALS — BP 132/74 | Ht 61.0 in | Wt 301.6 lb

## 2020-06-10 DIAGNOSIS — Z9989 Dependence on other enabling machines and devices: Secondary | ICD-10-CM | POA: Diagnosis not present

## 2020-06-10 DIAGNOSIS — G4733 Obstructive sleep apnea (adult) (pediatric): Secondary | ICD-10-CM

## 2020-06-10 NOTE — Progress Notes (Addendum)
PATIENT: Cassandra Barrera DOB: [birth year]  REASON FOR VISIT: follow up HISTORY FROM: patient  Chief Complaint  Patient presents with  . Follow-up    76yrf/u for OSA  . treatment room    alone     HISTORY OF PRESENT ILLNESS: Today 06/10/20 Cassandra MAYVILLEis a 49y.o. female here today for follow up for OSA on CPAP.  She is doing very well on CPAP therapy.  She reports that she cannot sleep without it.  She was notified of a recall.  She does not use an external cleanser.  No prolonged exposure to heat.  She has not noticed any black particles in her water filter.  She denies any concerns today.  Compliance report dated 05/10/2020 through 06/08/2020 reveals that she used CPAP 30 of the past 30 days for compliance of 100%.  She used CPAP greater than 4 hours all 30 days for compliance of 100%.  Average usage was 7 hours and 45 minutes.  Residual AHI was 0.7 on 12 cm of water.  There was no significant leak noted.   HISTORY: (copied from Dr AGuadelupe Sabinnote on 06/10/2020)  Ms. PHaggardis a 49year old right-handed woman with an underlying medical history of anxiety, depression, hypothyroidism, allergies and morbid obesity with a BMI of over 546 who presents for follow-up consultation of her obstructive sleep apnea, well-established on CPAP therapy.  The patient is accompanied by her Mother-in-law today and presents for her yearly checkup.  I last saw her on 12/06/2017 after her sleep study testing and starting CPAP therapy.  She was compliant with treatment and doing well, felt much improved.  She saw Cassandra Barrera nurse practitioner in the interim on 06/08/2018, at which time she was compliant with treatment and advised to follow-up yearly.  Today, 06/11/2019: I reviewed her CPAP compliance data from 05/07/2019 through 06/05/2019 which is a total of 30 days, during which time she used her machine every night with percent use days greater than 4 hours at 100%, indicating superb compliance with  an average usage of 7 hours and 24 minutes, residual AHI at goal at 0.7/h, leak low, pressure at 12 cm.  She reports that her CPAP is going well.  She uses nasal pillows, she is generally up-to-date with her supplies and sleeps well.  She works at [employer] her older daughter is at [employer] her younger one during remote learning, sophomore in high school.  She denies any acute illness or new medications.  Her weight may have increased since the pandemic she estimates.   The patient's allergies, current medications, family history, past medical history, past social history, past surgical history and problem list were reviewed and updated as appropriate.  Previously:   I first met her on 08/04/2017 at the request of her primary care provider, at which time she reported snoring, witnessed apneas as well as daytime somnolence. I asked her to proceed with a sleep study. She had a split-night sleep study on 09/26/2017. I went over her test results with her in detail today. Baseline sleep efficiency was 81.9% with a sleep latency of 22 minutes and REM sleep was absent. She had an increased percentage of light stage sleep and absence of slow-wave sleep and REM sleep. She had moderate to loud snoring, total AHI was highly elevated at 165.3 per hour in the absence of REM sleep achieved. Overall oxygen saturation was 96% and nadir was in the severe range at 66% with significant  time below 89% saturation of over 90 minutes. She was titrated with CPAP from 5 cm to 12 cm. She was fitted with nasal pillows and on the final titration pressure her AHI was 0 per hour with non-supine REM sleep achieved an O2 nadir of 90%. She had no significant PLMS during the study. She had no significant EKG changes during the study. She had a significant amount of REM rebound during the titration portion of the study. Based on her test results I prescribed CPAP therapy for home use at a pressure of 12 cm.  I  reviewed her CPAP compliance data from 11/05/2017 through 12/04/2017 which is a total of 30 days, during which time she used her machine every night with percent used days greater than 4 hours at 93.3%, indicating excellent compliance with an average usage of 7 hours and 7 minutes which is also very good, residual AHI at goal at 0.8 per hour, leak very low, pressure at 12 cm.   08/04/2017: (She) reports snoring and excessive daytime somnolence as well as a family history of OSA. Her husband has witnessed apneas while she is asleep. I reviewed your office note from 07/28/2017. Her Epworth sleepiness score is 20 out of 24, fatigue score is 32 out of 63. She does not wake up rested. She wakes up with a sense of gasping for air. She lives with her husband and children. She has 2 daughters, ages 6 and 26. She works for [employer], part-time as a Licensed conveyancer, she also tutors math. She is a nonsmoker and does not utilize alcohol and drinks caffeine in the form of soda, 2-3 per day.  She is usually in bed by 10 PM , she does not watch TV in bed. They do have a dog in the bedroom and sometimes on her bed. She has a wake time of around 7. She denies any telltale symptoms of restless leg syndrome or leg twitching at night. She has nocturia about once per night on average. She has come close to dozing off at work. She does not take any scheduled naps but has lately fallen asleep inadvertently when sedentary at home. She has gained some weight in the past few years. She has gradually become more sleepy during the day. Her father has obstructive sleep apnea and uses a CPAP machine.   REVIEW OF SYSTEMS: Out of a complete 14 system review of symptoms, the patient complains only of the following symptoms, none and all other reviewed systems are negative.  ESS: 1 FSS: 18  ALLERGIES: Allergies  Allergen Reactions  . Sulfa Antibiotics Other (See Comments)    Reaction: lightheaded    HOME  MEDICATIONS: Outpatient Medications Prior to Visit  Medication Sig Dispense Refill  . levothyroxine (SYNTHROID) 200 MCG tablet Take 1 tablet (200 mcg total) by mouth daily before breakfast. Take with 80mg for a total of 2251mdaily. 30 tablet 0  . levothyroxine (SYNTHROID) 50 MCG tablet Take 1 tablet (50 mcg total) by mouth daily before breakfast. 30 tablet 0  . loratadine (CLARITIN) 10 MG tablet Take 1 tablet (10 mg total) by mouth daily. 30 tablet 11  . sertraline (ZOLOFT) 50 MG tablet Take 3 tablets (150 mg total) by mouth daily. 270 tablet 3  . fluticasone (FLONASE) 50 MCG/ACT nasal spray Place 2 sprays into both nostrils daily. (Patient not taking: Reported on 12/11/2019) 16 g 6  . HYDROcodone-acetaminophen (NORCO/VICODIN) 5-325 MG tablet Take 1 tablet by mouth every 6 (six) hours as needed  for severe pain. (Patient not taking: Reported on 09/11/2019) 8 tablet 0  . meclizine (ANTIVERT) 25 MG tablet Take 1 tablet (25 mg total) by mouth 3 (three) times daily as needed for dizziness. (Patient not taking: Reported on 12/11/2019) 30 tablet 0   No facility-administered medications prior to visit.    PAST MEDICAL HISTORY: Past Medical History:  Diagnosis Date  . Anxiety   . Depression   . OSA on CPAP   . Thyroid disease    underactive    PAST SURGICAL HISTORY: No past surgical history on file.  FAMILY HISTORY: Family History  Problem Relation Age of Onset  . Hypertension Mother   . Diabetes Mother   . Heart disease Mother   . Diabetes Father     SOCIAL HISTORY: Social History   Socioeconomic History  . Marital status: Married    Spouse name: Jacqulyn Bath "Fara Olden"  . Number of children: 2  . Years of education: college  . Highest education level: Not on file  Occupational History  . 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 Smoking status: Never Smoker  . Smokeless tobacco: Never Used  Substance and Sexual Activity  . Alcohol use: No     Alcohol/week: 0.0 standard drinks  . Drug use: No  . Sexual activity: Yes    Birth control/protection: I.U.D.  Other Topics Concern  . Not on file  Social History Narrative   Lives with her husband and their 2 daughters.   Parents live in [place].   Her sister and her husband's family live in [place].   Social Determinants of Health   Financial Resource Strain:   . Difficulty of Paying Living Expenses: Not on file  Food Insecurity:   . Worried About Charity fundraiser in the Last Year: Not on file  . Ran Out of Food in the Last Year: Not on file  Transportation Needs:   . Lack of Transportation (Medical): Not on file  . Lack of Transportation (Non-Medical): Not on file  Physical Activity:   . Days of Exercise per Week: Not on file  . Minutes of Exercise per Session: Not on file  Stress:   . Feeling of Stress : Not on file  Social Connections:   . Frequency of Communication with Friends and Family: Not on file  . Frequency of Social Gatherings with Friends and Family: Not on file  . Attends Religious Services: Not on file  . Active Member of Clubs or Organizations: Not on file  . Attends Archivist Meetings: Not on file  . Marital Status: Not on file  Intimate Partner Violence:   . Fear of Current or Ex-Partner: Not on file  . Emotionally Abused: Not on file  . Physically Abused: Not on file  . Sexually Abused: Not on file      PHYSICAL EXAM  Vitals:   06/10/20 1507  BP: 132/74  Weight: (!) 301 lb 9.6 oz (136.8 kg)  Height: _0  (1.549 m)   Body mass index is 56.99 kg/m.  Generalized: Well developed, in no acute distress  Cardiology: normal rate and rhythm, no murmur noted Respiratory: clear to auscultation bilaterally  Neurological examination  Mentation: Alert oriented to time, place, history taking. Follows all commands speech and language fluent Cranial nerve II-XII: Pupils were equal round reactive to light. Extraocular movements were  full, visual field were full Motor: The motor testing reveals 5 over 5 strength of  all 4 extremities. Good symmetric motor tone is noted throughout.  Gait and station: Gait is normal.    DIAGNOSTIC DATA (LABS, IMAGING, TESTING) - I reviewed patient records, labs, notes, testing and imaging myself where available.  No flowsheet data found.   Lab Results  Component Value Date   WBC 11.9 (H) 03/13/2019   HGB 13.7 03/13/2019   HCT 42.3 03/13/2019   MCV 89 03/13/2019   PLT 385 03/13/2019      Component Value Date/Time   NA 139 09/11/2019 1556   K 4.3 09/11/2019 1556   CL 101 09/11/2019 1556   CO2 22 09/11/2019 1556   GLUCOSE 96 09/11/2019 1556   GLUCOSE 69 (L) 03/06/2013 1301   BUN 14 09/11/2019 1556   CREATININE 0.64 09/11/2019 1556   CREATININE 0.53 03/06/2013 1301   CALCIUM 9.6 09/11/2019 1556   PROT 7.1 07/28/2017 1602   ALBUMIN 4.2 07/28/2017 1602   AST 16 07/28/2017 1602   ALT 13 07/28/2017 1602   ALKPHOS 62 07/28/2017 1602   BILITOT 0.2 07/28/2017 1602   GFRNONAA 107 09/11/2019 1556   GFRAA 124 09/11/2019 1556   Lab Results  Component Value Date   CHOL 163 03/13/2019   HDL 34 (L) 03/13/2019   LDLCALC 79 03/13/2019   TRIG 250 (H) 03/13/2019   CHOLHDL 4.8 (H) 03/13/2019   Lab Results  Component Value Date   HGBA1C 5.5 09/11/2019   No results found for: [id number] Lab Results  Component Value Date   TSH 10.100 (H) 09/11/2019       ASSESSMENT AND PLAN 49 y.o. year old female  has a past medical history of Anxiety, Depression, OSA on CPAP, and Thyroid disease. here with     ICD-10-CM   1. OSA on CPAP  G47.33 For home use only DME continuous positive airway pressure (CPAP)   Z99.89     Lecia is doing very well with CPAP therapy.  Compliance report reveals excellent compliance.  She was encouraged to continue using CPAP nightly and for greater than 4 hours each night.  Healthy lifestyle habits encouraged.  She will continue close follow-up with  primary care.  She will follow-up with [place] in 1 year, sooner if needed.  She verbalizes understanding and agreement with this plan.   Orders Placed This Encounter  Procedures  . For home use only DME continuous positive airway pressure (CPAP)    Supplies    Order Specific Question:   Length of Need    Answer:   Lifetime    Order Specific Question:   Patient has OSA or probable OSA    Answer:   Yes    Order Specific Question:   Is the patient currently using CPAP in the home    Answer:   Yes    Order Specific Question:   Settings    Answer:   Other see comments    Order Specific Question:   CPAP supplies needed    Answer:   Mask, headgear, cushions, filters, heated tubing and water chamber     No orders of the defined types were placed in this encounter.     I spent 15 minutes with the patient. 50% of this time was spent counseling and educating patient on plan of care and medications.    Debbora Presto, FNP-C 06/10/2020, 4:20 PM [facility] [address] [place], [place] [address] [phone]  I reviewed the above note and documentation by the Nurse Practitioner and agree with  the history, exam, assessment and plan as outlined above. I was available for consultation. Star Age, MD, PhD [facility])

## 2020-06-10 NOTE — Progress Notes (Signed)
Order for cpap supplies sent to [facility] via community msg. Confirmation received that the order transmitted was successful.  

## 2020-06-10 NOTE — Patient Instructions (Signed)
Please continue using your CPAP regularly. While your insurance requires that you use CPAP at least 4 hours each night on 70% of the nights, I recommend, that you not skip any nights and use it throughout the night if you can. Getting used to CPAP and staying with the treatment long term does take time and patience and discipline. Untreated obstructive sleep apnea when it is moderate to severe can have an adverse impact on cardiovascular health and raise her risk for heart disease, arrhythmias, hypertension, congestive heart failure, stroke and diabetes. Untreated obstructive sleep apnea causes sleep disruption, nonrestorative sleep, and sleep deprivation. This can have an impact on your day to day functioning and cause daytime sleepiness and impairment of cognitive function, memory loss, mood disturbance, and problems focussing. Using CPAP regularly can improve these symptoms.   Follow up in 1 year   Sleep Apnea Sleep apnea affects breathing during sleep. It causes breathing to stop for a short time or to become shallow. It can also increase the risk of:  Heart attack.  Stroke.  Being very overweight (obese).  Diabetes.  Heart failure.  Irregular heartbeat. The goal of treatment is to help you breathe normally again. What are the causes? There are three kinds of sleep apnea:  Obstructive sleep apnea. This is caused by a blocked or collapsed airway.  Central sleep apnea. This happens when the brain does not send the right signals to the muscles that control breathing.  Mixed sleep apnea. This is a combination of obstructive and central sleep apnea. The most common cause of this condition is a collapsed or blocked airway. This can happen if:  Your throat muscles are too relaxed.  Your tongue and tonsils are too large.  You are overweight.  Your airway is too small. What increases the risk?  Being overweight.  Smoking.  Having a small airway.  Being older.  Being  female.  Drinking alcohol.  Taking medicines to calm yourself (sedatives or tranquilizers).  Having family members with the condition. What are the signs or symptoms?  Trouble staying asleep.  Being sleepy or tired during the day.  Getting angry a lot.  Loud snoring.  Headaches in the morning.  Not being able to focus your mind (concentrate).  Forgetting things.  Less interest in sex.  Mood swings.  Personality changes.  Feelings of sadness (depression).  Waking up a lot during the night to pee (urinate).  Dry mouth.  Sore throat. How is this diagnosed?  Your medical history.  A physical exam.  A test that is done when you are sleeping (sleep study). The test is most often done in a sleep lab but may also be done at home. How is this treated?   Sleeping on your side.  Using a medicine to get rid of mucus in your nose (decongestant).  Avoiding the use of alcohol, medicines to help you relax, or certain pain medicines (narcotics).  Losing weight, if needed.  Changing your diet.  Not smoking.  Using a machine to open your airway while you sleep, such as: ? An oral appliance. This is a mouthpiece that shifts your lower jaw forward. ? A CPAP device. This device blows air through a mask when you breathe out (exhale). ? An EPAP device. This has valves that you put in each nostril. ? A BPAP device. This device blows air through a mask when you breathe in (inhale) and breathe out.  Having surgery if other treatments do not work. It is   important to get treatment for sleep apnea. Without treatment, it can lead to:  High blood pressure.  Coronary artery disease.  In men, not being able to have an erection (impotence).  Reduced thinking ability. Follow these instructions at home: Lifestyle  Make changes that your doctor recommends.  Eat a healthy diet.  Lose weight if needed.  Avoid alcohol, medicines to help you relax, and some pain  medicines.  Do not use any products that contain nicotine or tobacco, such as cigarettes, e-cigarettes, and chewing tobacco. If you need help quitting, ask your doctor. General instructions  Take over-the-counter and prescription medicines only as told by your doctor.  If you were given a machine to use while you sleep, use it only as told by your doctor.  If you are having surgery, make sure to tell your doctor you have sleep apnea. You may need to bring your device with you.  Keep all follow-up visits as told by your doctor. This is important. Contact a doctor if:  The machine that you were given to use during sleep bothers you or does not seem to be working.  You do not get better.  You get worse. Get help right away if:  Your chest hurts.  You have trouble breathing in enough air.  You have an uncomfortable feeling in your back, arms, or stomach.  You have trouble talking.  One side of your body feels weak.  A part of your face is hanging down. These symptoms may be an emergency. Do not wait to see if the symptoms will go away. Get medical help right away. Call your local emergency services (911 in the [place] Do not drive yourself to the hospital. Summary  This condition affects breathing during sleep.  The most common cause is a collapsed or blocked airway.  The goal of treatment is to help you breathe normally while you sleep. This information is not intended to replace advice given to you by your health care provider. Make sure you discuss any questions you have with your health care provider. Document Revised: 06/23/2018 Document Reviewed: 05/02/2018 Elsevier Patient Education  2020 [employer]  

## 2020-06-20 ENCOUNTER — Encounter: Payer: BC Managed Care – PPO | Admitting: Registered Nurse

## 2020-06-30 ENCOUNTER — Ambulatory Visit: Payer: BC Managed Care – PPO | Admitting: Family Medicine

## 2020-07-01 ENCOUNTER — Encounter: Payer: Self-pay | Admitting: Family Medicine

## 2020-07-01 ENCOUNTER — Other Ambulatory Visit: Payer: Self-pay

## 2020-07-01 ENCOUNTER — Ambulatory Visit: Payer: BC Managed Care – PPO | Admitting: Family Medicine

## 2020-07-01 VITALS — BP 110/64 | HR 84 | Temp 98.1°F | Ht 61.0 in | Wt 296.0 lb

## 2020-07-01 DIAGNOSIS — F32A Depression, unspecified: Secondary | ICD-10-CM

## 2020-07-01 DIAGNOSIS — Z23 Encounter for immunization: Secondary | ICD-10-CM

## 2020-07-01 DIAGNOSIS — Z6841 Body Mass Index (BMI) 40.0 and over, adult: Secondary | ICD-10-CM

## 2020-07-01 DIAGNOSIS — E039 Hypothyroidism, unspecified: Secondary | ICD-10-CM

## 2020-07-01 DIAGNOSIS — Z780 Asymptomatic menopausal state: Secondary | ICD-10-CM

## 2020-07-01 DIAGNOSIS — F419 Anxiety disorder, unspecified: Secondary | ICD-10-CM

## 2020-07-01 DIAGNOSIS — Z13220 Encounter for screening for lipoid disorders: Secondary | ICD-10-CM

## 2020-07-01 MED ORDER — LEVOTHYROXINE SODIUM 200 MCG PO TABS: 200.0000 ug | ORAL_TABLET | Freq: Every day | ORAL | 3 refills | Status: DC

## 2020-07-01 MED ORDER — LEVOTHYROXINE SODIUM 50 MCG PO TABS: 50.0000 ug | ORAL_TABLET | Freq: Every day | ORAL | 3 refills | Status: DC

## 2020-07-01 NOTE — Progress Notes (Addendum)
10/12/202111:54 AM  Cassandra Barrera [birth year], 48 y.o., female [id number]  Chief Complaint  Patient presents with  . Hypothyroidism    labs and medication refills 250 mcg 90 days supply    HPI:   Patient is a 49 y.o. female with past medical history significant for hypothoryoidism who presents today for medication refill  Depression screen [facility] 2/9 07/01/2020 12/11/2019 09/11/2019  Decreased Interest 0 0 0  Down, Depressed, Hopeless 0 0 0  PHQ - 2 Score 0 0 0  Altered sleeping - - -  Tired, decreased energy - - -  Change in appetite - - -  Feeling bad or failure about yourself  - - -  Trouble concentrating - - -  Moving slowly or fidgety/restless - - -  Suicidal thoughts - - -  PHQ-9 Score - - -  Difficult doing work/chores - - -    Fall Risk  07/01/2020 12/11/2019 09/11/2019 03/13/2019 04/20/2018  Falls in the past year? 0 0 0 0 No  Number falls in past yr: 0 0 0 0 -  Injury with Fall? 0 0 0 0 -  Follow up Falls evaluation completed Falls evaluation completed - Falls evaluation completed -     Allergies  Allergen Reactions  . Sulfa Antibiotics Other (See Comments)    Reaction: lightheaded    Prior to Admission medications   Medication Sig Start Date End Date Taking? Authorizing Provider  levothyroxine (SYNTHROID) 200 MCG tablet Take 1 tablet (200 mcg total) by mouth daily before breakfast. Take with for a total of 225mg  daily. 05/08/20  Yes 05/10/20, NP  levothyroxine (SYNTHROID) 50 MCG tablet Take 1 tablet (50 mcg total) by mouth daily before breakfast. 05/08/20  Yes 05/10/20, NP  loratadine (CLARITIN) 10 MG tablet Take 1 tablet (10 mg total) by mouth daily. 04/20/18  Yes McVey, 06/20/18, [name]  sertraline (ZOLOFT) 50 MG tablet Take 3 tablets (150 mg total) by mouth daily. 06/05/20 09/03/20 Yes 09/05/20, NP    Past Medical History:  Diagnosis Date  . Anxiety   . Depression   . OSA on CPAP   . Thyroid disease    underactive     No past surgical history on file.  Social History   Tobacco Use  . Smoking status: Never Smoker  . Smokeless tobacco: Never Used  Substance Use Topics  . Alcohol use: No    Alcohol/week: 0.0 standard drinks    Family History  Problem Relation Age of Onset  . Hypertension Mother   . Diabetes Mother   . Heart disease Mother   . Diabetes Father     Review of Systems  Constitutional: Negative for malaise/fatigue.  Eyes: Negative for blurred vision, double vision and pain.  Respiratory: Negative for cough, shortness of breath and wheezing.   Cardiovascular: Negative for chest pain, palpitations and leg swelling.  Gastrointestinal: Negative for abdominal pain, blood in stool, constipation, diarrhea, heartburn, nausea and vomiting.  Genitourinary: Negative for dysuria, frequency and hematuria.  Musculoskeletal: Negative for back pain and joint pain.  Neurological: Negative for dizziness, weakness and headaches.  Endo/Heme/Allergies: Negative for polydipsia. Does not bruise/bleed easily.  Psychiatric/Behavioral: Negative for suicidal ideas. The patient is not nervous/anxious.      OBJECTIVE:  Today's Vitals   07/01/20 1058  BP: 110/64  Pulse: 84  Temp: 98.1 F (36.7 C)  SpO2: 96%  Weight: 296 lb (134.3 kg)  Height: 5\' 1"  (1.549 m)   Body mass index is  55.93 kg/m.  Lab Results  Component Value Date   TSH 10.100 (H) 09/11/2019   Wt Readings from Last 3 Encounters:  07/01/20 296 lb (134.3 kg)  06/10/20 (!) 301 lb 9.6 oz (136.8 kg)  09/11/19 288 lb (130.6 kg)     Physical Exam Constitutional:      General: She is not in acute distress.    Appearance: Normal appearance. She is not ill-appearing.  HENT:     Head: Normocephalic.  Eyes:     Pupils: Pupils are equal, round, and reactive to light.  Neck:     Thyroid: No thyroid mass, thyromegaly or thyroid tenderness.  Cardiovascular:     Rate and Rhythm: Normal rate and regular rhythm.     Pulses: Normal  pulses.     Heart sounds: Normal heart sounds.  Pulmonary:     Effort: Pulmonary effort is normal. No respiratory distress.     Breath sounds: Normal breath sounds.  Musculoskeletal:     Right lower leg: Edema present.     Left lower leg: Edema present.  Skin:    General: Skin is warm and dry.  Neurological:     Mental Status: She is alert and oriented to person, place, and time.  Psychiatric:        Mood and Affect: Mood normal.     No results found for this or any previous visit (from the past 24 hour(s)).  No results found.   ASSESSMENT and PLAN  Problem List Items Addressed This Visit      Endocrine   Hypothyroidism - Primary   Relevant Medications   levothyroxine (SYNTHROID) 200 MCG tablet   levothyroxine (SYNTHROID) 50 MCG tablet   Other Relevant Orders   TSH: Last 10 in 09/2019, dose was increased. Rechecked labs today Will follow up with dosing reqs.     Other   Anxiety and depression   Morbid obesity with BMI of 50.0-59.9, adult (HCC)   Relevant Orders   Basic Metabolic Panel    Other Visit Diagnoses    Encounter for immunization       Relevant Orders   Flu Vaccine QUAD 36+ mos IM (Completed)   Tdap vaccine greater than or equal to 7yo IM (Completed)   Screening for lipid disorders       Relevant Orders   Lipid Panel   Menopause       Relevant Orders   Follicle Stimulating Hormone: Will follow up if greater than 25 Consider patient in menopause Records requested to determine when Mirena IUD was placed  And results of previous PAP. Will determine follow up  Screening: Mammogram: no family history Colon Cancer: No family history       Return in about 3 months (around 10/01/2020) for For pap and IUD plan.  I have reviewed and agree with above documentation. Edwina Barth, MD   [facility]ham, FNP-BC Primary Care at [facility] [address] [place], [place] [address] Ph.  [phone] Fax [phone]

## 2020-07-01 NOTE — Patient Instructions (Addendum)
Administered Tdap and Flu today Will follow up with TSH results and levothyroxine dose [facility] and determine a plan for PAP and IUD    If you have lab work done today you will be contacted with your lab results within the next 2 weeks.  If you have not heard from us then please contact us. The fastest way to get your results is to register for My Chart.   IF you received an x-ray today, you will receive an invoice from [facility]. Please contact [facility] at [phone] with questions or concerns regarding your invoice.   IF you received labwork today, you will receive an invoice from [facility]. Please contact [facility] at [phone] with questions or concerns regarding your invoice.   Our billing staff will not be able to assist you with questions regarding bills from these companies.  You will be contacted with the lab results as soon as they are available. The fastest way to get your results is to activate your My Chart account. Instructions are located on the last page of this paperwork. If you have not heard from us regarding the results in 2 weeks, please contact this office.      Health Maintenance, Female Adopting a healthy lifestyle and getting preventive care are important in promoting health and wellness. Ask your health care provider about:  The right schedule for you to have regular tests and exams.  Things you can do on your own to prevent diseases and keep yourself healthy. What should I know about diet, weight, and exercise? Eat a healthy diet   Eat a diet that includes plenty of vegetables, fruits, low-fat dairy products, and lean protein.  Do not eat a lot of foods that are high in solid fats, added sugars, or sodium. Maintain a healthy weight Body mass index (BMI) is used to identify weight problems. It estimates body fat based on height and weight. Your health care provider can help determine your BMI and help you achieve or  maintain a healthy weight. Get regular exercise Get regular exercise. This is one of the most important things you can do for your health. Most adults should:  Exercise for at least 150 minutes each week. The exercise should increase your heart rate and make you sweat (moderate-intensity exercise).  Do strengthening exercises at least twice a week. This is in addition to the moderate-intensity exercise.  Spend less time sitting. Even light physical activity can be beneficial. Watch cholesterol and blood lipids Have your blood tested for lipids and cholesterol at 47 years of age, then have this test every 5 years. Have your cholesterol levels checked more often if:  Your lipid or cholesterol levels are high.  You are older than 47 years of age.  You are at high risk for heart disease. What should I know about cancer screening? Depending on your health history and family history, you may need to have cancer screening at various ages. This may include screening for:  Breast cancer.  Cervical cancer.  Colorectal cancer.  Skin cancer.  Lung cancer. What should I know about heart disease, diabetes, and high blood pressure? Blood pressure and heart disease  High blood pressure causes heart disease and increases the risk of stroke. This is more likely to develop in people who have high blood pressure readings, are of African descent, or are overweight.  Have your blood pressure checked: ? Every 3-5 years if you are 5018-47 years of age. ? Every year  if you are 7 years old or older. Diabetes Have regular diabetes screenings. This checks your fasting blood sugar level. Have the screening done:  Once every three years after age 12 if you are at a normal weight and have a low risk for diabetes.  More often and at a younger age if you are overweight or have a high risk for diabetes. What should I know about preventing infection? Hepatitis B If you have a higher risk for hepatitis B,  you should be screened for this virus. Talk with your health care provider to find out if you are at risk for hepatitis B infection. Hepatitis C Testing is recommended for:  Everyone born from [birth year] through 1963.  Anyone with known risk factors for hepatitis C. Sexually transmitted infections (STIs)  Get screened for STIs, including gonorrhea and chlamydia, if: ? You are sexually active and are younger than 47 years of age. ? You are older than 47 years of age and your health care provider tells you that you are at risk for this type of infection. ? Your sexual activity has changed since you were last screened, and you are at increased risk for chlamydia or gonorrhea. Ask your health care provider if you are at risk.  Ask your health care provider about whether you are at high risk for HIV. Your health care provider may recommend a prescription medicine to help prevent HIV infection. If you choose to take medicine to prevent HIV, you should first get tested for HIV. You should then be tested every 3 months for as long as you are taking the medicine. Pregnancy  If you are about to stop having your period (premenopausal) and you may become pregnant, seek counseling before you get pregnant.  Take 400 to 800 micrograms (mcg) of folic acid every day if you become pregnant.  Ask for birth control (contraception) if you want to prevent pregnancy. Osteoporosis and menopause Osteoporosis is a disease in which the bones lose minerals and strength with aging. This can result in bone fractures. If you are 33 years old or older, or if you are at risk for osteoporosis and fractures, ask your health care provider if you should:  Be screened for bone loss.  Take a calcium or vitamin D supplement to lower your risk of fractures.  Be given hormone replacement therapy (HRT) to treat symptoms of menopause. Follow these instructions at home: Lifestyle  Do not use any products that contain nicotine or  tobacco, such as cigarettes, e-cigarettes, and chewing tobacco. If you need help quitting, ask your health care provider.  Do not use street drugs.  Do not share needles.  Ask your health care provider for help if you need support or information about quitting drugs. Alcohol use  Do not drink alcohol if: ? Your health care provider tells you not to drink. ? You are pregnant, may be pregnant, or are planning to become pregnant.  If you drink alcohol: ? Limit how much you use to 0-1 drink a day. ? Limit intake if you are breastfeeding.  Be aware of how much alcohol is in your drink. In the [place] one drink equals one 12 oz bottle of beer (355 mL), one 5 oz glass of wine (148 mL), or one 1 oz glass of hard liquor (44 mL). General instructions  Schedule regular health, dental, and eye exams.  Stay current with your vaccines.  Tell your health care provider if: ? You often feel depressed. ? You have  ever been abused or do not feel safe at home. Summary  Adopting a healthy lifestyle and getting preventive care are important in promoting health and wellness.  Follow your health care provider's instructions about healthy diet, exercising, and getting tested or screened for diseases.  Follow your health care provider's instructions on monitoring your cholesterol and blood pressure. This information is not intended to replace advice given to you by your health care provider. Make sure you discuss any questions you have with your health care provider. Document Revised: 08/30/2018 Document Reviewed: 08/30/2018 Elsevier Patient Education  2020 [employer].  Hypothyroidism  Hypothyroidism is when the thyroid gland does not make enough of certain hormones (it is underactive). The thyroid gland is a small gland located in the lower front part of the neck, just in front of the windpipe (trachea). This gland makes hormones that help control how the body uses food for energy (metabolism) as  well as how the heart and brain function. These hormones also play a role in keeping your bones strong. When the thyroid is underactive, it produces too little of the hormones thyroxine (T4) and triiodothyronine (T3). What are the causes? This condition may be caused by:  Hashimoto's disease. This is a disease in which the body's disease-fighting system (immune system) attacks the thyroid gland. This is the most common cause.  Viral infections.  Pregnancy.  Certain medicines.  Birth defects.  Past radiation treatments to the head or neck for cancer.  Past treatment with radioactive iodine.  Past exposure to radiation in the environment.  Past surgical removal of part or all of the thyroid.  Problems with a gland in the center of the brain (pituitary gland).  Lack of enough iodine in the diet. What increases the risk? You are more likely to develop this condition if:  You are female.  You have a family history of thyroid conditions.  You use a medicine called lithium.  You take medicines that affect the immune system (immunosuppressants). What are the signs or symptoms? Symptoms of this condition include:  Feeling as though you have no energy (lethargy).  Not being able to tolerate cold.  Weight gain that is not explained by a change in diet or exercise habits.  Lack of appetite.  Dry skin.  Coarse hair.  Menstrual irregularity.  Slowing of thought processes.  Constipation.  Sadness or depression. How is this diagnosed? This condition may be diagnosed based on:  Your symptoms, your medical history, and a physical exam.  Blood tests. You may also have imaging tests, such as an ultrasound or MRI. How is this treated? This condition is treated with medicine that replaces the thyroid hormones that your body does not make. After you begin treatment, it may take several weeks for symptoms to go away. Follow these instructions at home:  Take  over-the-counter and prescription medicines only as told by your health care provider.  If you start taking any new medicines, tell your health care provider.  Keep all follow-up visits as told by your health care provider. This is important. ? As your condition improves, your dosage of thyroid hormone medicine may change. ? You will need to have blood tests regularly so that your health care provider can monitor your condition. Contact a health care provider if:  Your symptoms do not get better with treatment.  You are taking thyroid replacement medicine and you: ? Sweat a lot. ? Have tremors. ? Feel anxious. ? Lose weight rapidly. ? Cannot tolerate  heat. ? Have emotional swings. ? Have diarrhea. ? Feel weak. Get help right away if you have:  Chest pain.  An irregular heartbeat.  A rapid heartbeat.  Difficulty breathing. Summary  Hypothyroidism is when the thyroid gland does not make enough of certain hormones (it is underactive).  When the thyroid is underactive, it produces too little of the hormones thyroxine (T4) and triiodothyronine (T3).  The most common cause is Hashimoto's disease, a disease in which the body's disease-fighting system (immune system) attacks the thyroid gland. The condition can also be caused by viral infections, medicine, pregnancy, or past radiation treatment to the head or neck.  Symptoms may include weight gain, dry skin, constipation, feeling as though you do not have energy, and not being able to tolerate cold.  This condition is treated with medicine to replace the thyroid hormones that your body does not make. This information is not intended to replace advice given to you by your health care provider. Make sure you discuss any questions you have with your health care provider. Document Revised: 08/19/2017 Document Reviewed: 08/17/2017 Elsevier Patient Education  2020 [employer].

## 2020-07-02 LAB — BASIC METABOLIC PANEL
BUN/Creatinine Ratio: 20 (ref 9–23)
BUN: 12 mg/dL (ref 6–24)
CO2: 25 mmol/L (ref 20–29)
Calcium: 9.8 mg/dL (ref 8.7–10.2)
Chloride: 101 mmol/L (ref 96–106)
Creatinine, Ser: 0.61 mg/dL (ref 0.57–1.00)
GFR calc Af Amer: 125 mL/min/{1.73_m2} (ref >59)
GFR calc non Af Amer: 108 mL/min/{1.73_m2} (ref >59)
Glucose: 99 mg/dL (ref 65–99)
Potassium: 4.3 mmol/L (ref 3.5–5.2)
Sodium: 140 mmol/L (ref 134–144)

## 2020-07-02 LAB — LIPID PANEL
Chol/HDL Ratio: 4.9 ratio — ABNORMAL HIGH (ref 0.0–4.4)
Cholesterol, Total: 172 mg/dL (ref 100–199)
HDL: 35 mg/dL — ABNORMAL LOW (ref >39)
LDL Chol Calc (NIH): 75 mg/dL (ref 0–99)
Triglycerides: 391 mg/dL — ABNORMAL HIGH (ref 0–149)
VLDL Cholesterol Cal: 62 mg/dL — ABNORMAL HIGH (ref 5–40)

## 2020-07-02 LAB — FOLLICLE STIMULATING HORMONE: FSH: 16.4 m[IU]/mL

## 2020-07-02 LAB — TSH: TSH: 5.61 u[IU]/mL — ABNORMAL HIGH (ref 0.450–4.500)

## 2020-07-05 ENCOUNTER — Other Ambulatory Visit: Payer: Self-pay | Admitting: Registered Nurse

## 2020-07-05 DIAGNOSIS — E039 Hypothyroidism, unspecified: Secondary | ICD-10-CM

## 2020-07-11 ENCOUNTER — Encounter: Payer: Self-pay | Admitting: Family Medicine

## 2020-07-14 ENCOUNTER — Other Ambulatory Visit: Payer: Self-pay | Admitting: Family Medicine

## 2020-07-14 DIAGNOSIS — E039 Hypothyroidism, unspecified: Secondary | ICD-10-CM

## 2020-07-14 MED ORDER — LEVOTHYROXINE SODIUM 200 MCG PO TABS: 200.0000 ug | ORAL_TABLET | Freq: Every day | ORAL | 3 refills | Status: DC

## 2020-10-01 ENCOUNTER — Encounter: Payer: BC Managed Care – PPO | Admitting: Family Medicine

## 2021-06-15 NOTE — Progress Notes (Signed)
PATIENT: Cassandra Barrera DOB: August 10, 1971  REASON FOR VISIT: follow up HISTORY FROM: patient  Virtual Visit via Telephone Note  I connected with Cassandra Barrera on 06/16/21 at  8:30 AM EDT by telephone and verified that I am speaking with the correct person using two identifiers.   I discussed the limitations, risks, security and privacy concerns of performing an evaluation and management service by telephone and the availability of in person appointments. I also discussed with the patient that there may be a patient responsible charge related to this service. The patient expressed understanding and agreed to proceed.   History of Present Illness:  06/16/21 ALL (Mychart): Cassandra Barrera is a 50 y.o. female here today for follow up for OSA on CPAP. She continues to do very well. She is using CPAP nightly for at least 4 hours. She reports that she can not sleep without it. No concerns with machine or supplies.   Compliance report dated 05/16/2021-06/14/2021 shows that she used CPAP 30/30 days for greater than 4 hours for complaince of 100%. Average usage was 7hr . Residual AHI was 1.6/hr on set pressure of 12cmH20. No significant leak noted.   06/10/20 ALL:  Cassandra Barrera is a 50 y.o. female here today for follow up for OSA on CPAP.  She is doing very well on CPAP therapy.  She reports that she cannot sleep without it.  She was notified of a recall.  She does not use an external cleanser.  No prolonged exposure to heat.  She has not noticed any black particles in her water filter.  She denies any concerns today.   Compliance report dated 05/10/2020 through 06/08/2020 reveals that she used CPAP 30 of the past 30 days for compliance of 100%.  She used CPAP greater than 4 hours all 30 days for compliance of 100%.  Average usage was 7 hours and 45 minutes.  Residual AHI was 0.7 on 12 cm of water.  There was no significant leak noted.   Observations/Objective:  Generalized: Well  developed, in no acute distress  Mentation: Alert oriented to time, place, history taking. Follows all commands speech and language fluent   Assessment and Plan:  50 y.o. year old female  has a past medical history of Anxiety, Depression, OSA on CPAP, and Thyroid disease. here with    ICD-10-CM   1. OSA on CPAP  G47.33 For home use only DME continuous positive airway pressure (CPAP)   Z99.89      Cassandra Barrera continues to do very well on CPAP therapy. Compliance report reveals excellent compliance. She was encouraged to continue CPAP nightly for at least 4 hours every night. Supply orders updated. She will continue healthy lifestyle habits. Follow up in 1 year.    Orders Placed This Encounter  Procedures   For home use only DME continuous positive airway pressure (CPAP)    Supplies    Order Specific Question:   Length of Need    Answer:   [employer]    Order Specific Question:   Patient has OSA or probable OSA    Answer:   Yes    Order Specific Question:   Is the patient currently using CPAP in the home    Answer:   Yes    Order Specific Question:   Settings    Answer:   Other see comments    Order Specific Question:   CPAP supplies needed    Answer:   Mask, headgear, cushions, filters,  heated tubing and water chamber     No orders of the defined types were placed in this encounter.    Follow Up Instructions:  I discussed the assessment and treatment plan with the patient. The patient was provided an opportunity to ask questions and all were answered. The patient agreed with the plan and demonstrated an understanding of the instructions.   The patient was advised to call back or seek an in-person evaluation if the symptoms worsen or if the condition fails to improve as anticipated.  I provided 15 minutes of non-face-to-face time during this encounter. Patient located at their place of residence during Mychart visit. Provider is in the office.    Shawnie Dapper, NP

## 2021-06-15 NOTE — Patient Instructions (Addendum)

## 2021-06-16 ENCOUNTER — Telehealth (INDEPENDENT_AMBULATORY_CARE_PROVIDER_SITE_OTHER): Payer: BC Managed Care – PPO | Admitting: Family Medicine

## 2021-06-16 ENCOUNTER — Encounter: Payer: Self-pay | Admitting: Family Medicine

## 2021-06-16 DIAGNOSIS — Z9989 Dependence on other enabling machines and devices: Secondary | ICD-10-CM | POA: Diagnosis not present

## 2021-06-16 DIAGNOSIS — G4733 Obstructive sleep apnea (adult) (pediatric): Secondary | ICD-10-CM

## 2021-06-16 NOTE — Progress Notes (Signed)
Order for cpap supplies sent to [facility] via community message. Confirmation received that the order transmitted was successful.  

## 2021-06-28 ENCOUNTER — Other Ambulatory Visit: Payer: Self-pay | Admitting: Registered Nurse

## 2021-06-28 DIAGNOSIS — F32A Depression, unspecified: Secondary | ICD-10-CM

## 2021-06-28 DIAGNOSIS — F419 Anxiety disorder, unspecified: Secondary | ICD-10-CM

## 2021-06-29 NOTE — Telephone Encounter (Signed)
Patient is requesting a refill of the following medications: Requested Prescriptions   Pending Prescriptions Disp Refills   sertraline (ZOLOFT) 50 MG tablet [Pharmacy Med Name: SERTRALINE 50MG  TABLETS] 270 tablet 3    Sig: TAKE 3 TABLETS(150 MG) BY MOUTH DAILY    Date of patient request: 06/28/2021 Last office visit: 07/01/2020 Date of last refill: 06/05/2020 Last refill amount: 270 tablets 3 refills  Follow up time period per chart:

## 2021-09-26 ENCOUNTER — Other Ambulatory Visit: Payer: Self-pay | Admitting: Registered Nurse

## 2021-09-26 DIAGNOSIS — F32A Depression, unspecified: Secondary | ICD-10-CM

## 2021-09-26 DIAGNOSIS — F419 Anxiety disorder, unspecified: Secondary | ICD-10-CM

## 2021-12-29 ENCOUNTER — Encounter: Payer: Self-pay | Admitting: Adult Health

## 2021-12-29 ENCOUNTER — Ambulatory Visit: Payer: BC Managed Care – PPO | Admitting: Adult Health

## 2021-12-29 VITALS — Ht 61.0 in | Wt 302.0 lb

## 2021-12-29 DIAGNOSIS — F331 Major depressive disorder, recurrent, moderate: Secondary | ICD-10-CM

## 2021-12-29 DIAGNOSIS — F411 Generalized anxiety disorder: Secondary | ICD-10-CM

## 2021-12-29 MED ORDER — SERTRALINE HCL 50 MG PO TABS: ORAL_TABLET | ORAL | 3 refills | Status: DC

## 2021-12-29 NOTE — Progress Notes (Signed)
[facility] MD/PA/NP Initial Note ? ?12/29/2021 8:44 AM ?Cassandra Barrera  ?MRN:  [id number] ? ?Chief Complaint:  ? ?HPI:  ? ?Patient seen today for initial psychiatric evaluation. ? ?Her current provider has retired and she is currently taking Zoloft 150mg  daily and has for several years. Reports a long history of anxiety and depression throughout her family. Has tried different medications over the years and feels like Zoloft works well for her. Plans to find a new health care provider to maintain current health issues. ? ?Describes mood today as "ok". Pleasant. Denies tearfulness. Mood symptoms - denies depression, anxiety, and irritability. Mood is consistent. Stating "I'm doing alright". Currently taking Zoloft 150mg  daily and feels it works well. Stable interest and motivation. Taking medications as prescribed.  ?Energy levels "ok". Active, does not have a regular exercise routine.  ?Enjoys some usual interests and activities. Married. Lives with husband - 2 daughters 3 junior in college - 18 senior. Mother in law with Alzheimer's - Spending time with family. ?Appetite adequate. Weight stable. ?Sleeps well most nights. Averages 8 hours - uses CPAP. ?Focus and concentration stable. Completing tasks. Managing aspects of household. 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 ?Denies SI or HI.  ?Denies AH or VH. ?Denies self harm. ?Denies substance use. ? ?Previous medication trials:  Zoloft, Paxil, Effexor  ? ?Visit Diagnosis:  ?  ICD-10-CM   ?1. Generalized anxiety disorder  F41.1 sertraline (ZOLOFT) 50 MG tablet  ?  ?2. Major depressive disorder, recurrent episode, moderate (HCC)  F33.1 sertraline (ZOLOFT) 50 MG tablet  ?  ? ? ?Past Psychiatric History: Denies psychiatric hospitalization.  ? ?Past Medical History:  ?Past Medical History:  ?Diagnosis Date  ? Anxiety   ? Depression   ? OSA on CPAP   ? Thyroid disease   ? underactive  ? No past surgical history on file. ? ?Family Psychiatric History: Family  history of mental illness - anxiety and depression.  ? ?Family History:  ?Family History  ?Problem Relation Age of Onset  ? Hypertension Mother   ? Diabetes Mother   ? Heart disease Mother   ? Diabetes Father   ? ? ?Social History:  ?Social History  ? ?Socioeconomic History  ? Marital status: Married  ?  Spouse name: 36 "[employer]"  ? Number of children: 2  ? Years of education: college  ? Highest education level: Not on file  ?Occupational History  ? Occupation: part-time [employer]  ?  Comment: [employer]  ?Tobacco Use  ? Smoking status: Never  ? Smokeless tobacco: Never  ?Substance and Sexual Activity  ? Alcohol use: No  ?  Alcohol/week: 0.0 standard drinks  ? Drug use: No  ? Sexual activity: Yes  ?  Birth control/protection: I.U.D.  ?Other Topics Concern  ? Not on file  ?Social History Narrative  ? Lives with her husband and their 2 daughters.  ? Parents live in [place].  ? Her sister and her husband's family live in [place].  ? ?Social Determinants of Health  ? ?Financial Resource Strain: Not on file  ?Food Insecurity: Not on file  ?Transportation Needs: Not on file  ?Physical Activity: Not on file  ?Stress: Not on file  ?Social Connections: Not on file  ? ? ?Allergies:  ?Allergies  ?Allergen Reactions  ? Sulfa Antibiotics Other (See Comments)  ?  Reaction: lightheaded  ? ? ?Metabolic Disorder Labs: ?Lab Results  ?Component Value Date  ? HGBA1C 5.5 09/11/2019  ? ?  No results found for: PROLACTIN ?Lab Results  ?Component Value Date  ? CHOL 172 07/01/2020  ? TRIG 391 (H) 07/01/2020  ? HDL 35 (L) 07/01/2020  ? CHOLHDL 4.9 (H) 07/01/2020  ? VLDL 53 (H) 03/06/2013  ? LDLCALC 75 07/01/2020  ? LDLCALC 79 03/13/2019  ? ?Lab Results  ?Component Value Date  ? TSH 5.610 (H) 07/01/2020  ? TSH 10.100 (H) 09/11/2019  ? ? ?Therapeutic Level Labs: ?No results found for: LITHIUM ?No results found for: VALPROATE ?No components found for:  CBMZ ? ?Current Medications: ?Current Outpatient Medications  ?Medication Sig  Dispense Refill  ? levothyroxine (SYNTHROID) 200 MCG tablet Take 1 tablet (200 mcg total) by mouth daily before breakfast. Take with for a total of 250mg  daily. 90 tablet 3  ? levothyroxine (SYNTHROID) 50 MCG tablet Take 1 tablet (50 mcg total) by mouth daily before breakfast. 90 tablet 3  ? loratadine (CLARITIN) 10 MG tablet Take 1 tablet (10 mg total) by mouth daily. 30 tablet 11  ? sertraline (ZOLOFT) 50 MG tablet TAKE 3 TABLETS(150 MG) BY MOUTH DAILY 270 tablet 3  ? ?No current facility-administered medications for this visit.  ? ? ?Medication Side Effects: none ? ?Orders placed this visit:  No orders of the defined types were placed in this encounter. ? ? ?Psychiatric Specialty Exam: ? ?Review of Systems  ?Musculoskeletal:  Negative for gait problem.  ?Neurological:  Negative for tremors.  ?Psychiatric/Behavioral:    ?     Please refer to HPI   ?There were no vitals taken for this visit.There is no height or weight on file to calculate BMI.  ?General Appearance: Casual and Neat  ?Eye Contact:  Good  ?Speech:  Clear and Coherent and Normal Rate  ?Volume:  Normal  ?Mood:  Euthymic  ?Affect:  Appropriate and Congruent  ?Thought Process:  Coherent and Descriptions of Associations: Intact  ?Orientation:  Full (Time, Place, and Person)  ?Thought Content: Logical   ?Suicidal Thoughts:  No  ?Homicidal Thoughts:  No  ?Memory:  WNL  ?Judgement:  Good  ?Insight:  Good  ?Psychomotor Activity:  Normal  ?Concentration:  Concentration: Good and Attention Span: Good  ?Recall:  Good  ?Fund of Knowledge: Good  ?Language: Good  ?Assets:  Communication Skills ?Desire for Improvement ?Financial Resources/Insurance ?Housing ?Intimacy ?Leisure Time ?Physical Health ?Resilience ?Social Support ?Talents/Skills ?Transportation ?Vocational/Educational  ?ADL's:  Intact  ?Cognition: WNL  ?Prognosis:  Good  ? ?Screenings:  ?GAD-7   ? ?Flowsheet Row Video Visit from 12/11/2019 in Primary Care at [facility]  ?Total GAD-7 Score 0  ? ?   ? ?PHQ2-9   ? ?Flowsheet Row Office Visit from 07/01/2020 in Primary Care at [facility] Video Visit from 12/11/2019 in Primary Care at [facility] Visit from 09/11/2019 in Primary Care at [facility] Visit from 03/13/2019 in Primary Care at [facility] Visit from 04/20/2018 in Primary Care at [facility]  ?PHQ-2 Total Score 0 0 0 0 0  ?PHQ-9 Total Score -- -- -- 0 --  ? ?  ? ? ?Receiving Psychotherapy: No  ? ?Treatment Plan/Recommendations: ? ?Plan: ? ?PDMP reviewed ? ?Zoloft 150mg  daily ? ?RTC 1 year ? ?Patient advised to contact office with any questions, adverse effects, or acute worsening in signs and symptoms. ?  ? ?[facility], NP ? ?         ?

## 2022-01-25 NOTE — Telephone Encounter (Signed)
NA

## 2022-06-24 NOTE — Progress Notes (Deleted)
   PATIENT: Cassandra Barrera DOB: [birth year]  REASON FOR VISIT: follow up HISTORY FROM: patient  Virtual Visit via Telephone Note  I connected with Cassandra Barrera on 06/24/22 at  8:45 AM EDT by telephone and verified that I am speaking with the correct person using two identifiers.   I discussed the limitations, risks, security and privacy concerns of performing an evaluation and management service by telephone and the availability of in person appointments. I also discussed with the patient that there may be a patient responsible charge related to this service. The patient expressed understanding and agreed to proceed.   History of Present Illness:  06/24/22 ALL (Mychart): Cassandra Barrera returns for follow up for OSA on CPAP.   06/16/2021 ALL: (Mychart): Cassandra Barrera is a 51 y.o. female here today for follow up for OSA on CPAP. She continues to do very well. She is using CPAP nightly for at least 4 hours. She reports that she can not sleep without it. No concerns with machine or supplies.   Compliance report dated 05/16/2021-06/14/2021 shows that she used CPAP 30/30 days for greater than 4 hours for complaince of 100%. Average usage was 7hr 71min. Residual AHI was 1.6/hr on set pressure of 12cmH20. No significant leak noted.   06/10/20 ALL:  Cassandra Barrera is a 51 y.o. female here today for follow up for OSA on CPAP.  She is doing very well on CPAP therapy.  She reports that she cannot sleep without it.  She was notified of a recall.  She does not use an external cleanser.  No prolonged exposure to heat.  She has not noticed any black particles in her water filter.  She denies any concerns today.   Compliance report dated 05/10/2020 through 06/08/2020 reveals that she used CPAP 30 of the past 30 days for compliance of 100%.  She used CPAP greater than 4 hours all 30 days for compliance of 100%.  Average usage was 7 hours and 45 minutes.  Residual AHI was 0.7 on 12 cm of water.  There was  no significant leak noted.   Observations/Objective:  Generalized: Well developed, in no acute distress  Mentation: Alert oriented to time, place, history taking. Follows all commands speech and language fluent   Assessment and Plan:  51 y.o. year old female  has a past medical history of Anxiety, Depression, OSA on CPAP, and Thyroid disease. here with  No diagnosis found.  Cassandra Barrera continues to do very well on CPAP therapy. Compliance report reveals excellent compliance. She was encouraged to continue CPAP nightly for at least 4 hours every night. Supply orders updated. She will continue healthy lifestyle habits. Follow up in 1 year.    No orders of the defined types were placed in this encounter.    No orders of the defined types were placed in this encounter.     Follow Up Instructions:  I discussed the assessment and treatment plan with the patient. The patient was provided an opportunity to ask questions and all were answered. The patient agreed with the plan and demonstrated an understanding of the instructions.   The patient was advised to call back or seek an in-person evaluation if the symptoms worsen or if the condition fails to improve as anticipated.  I provided 15 minutes of non-face-to-face time during this encounter. Patient located at their place of residence during Cassandra Barrera visit. Provider is in the office.    Debbora Presto, NP

## 2022-06-29 ENCOUNTER — Ambulatory Visit: Payer: BC Managed Care – PPO | Admitting: Family Medicine

## 2022-06-29 ENCOUNTER — Telehealth: Payer: Self-pay

## 2022-06-29 ENCOUNTER — Telehealth: Payer: BC Managed Care – PPO | Admitting: Family Medicine

## 2022-06-29 ENCOUNTER — Encounter: Payer: Self-pay | Admitting: Family Medicine

## 2022-06-29 VITALS — BP 145/88 | HR 101 | Ht 60.0 in | Wt 308.0 lb

## 2022-06-29 DIAGNOSIS — G4733 Obstructive sleep apnea (adult) (pediatric): Secondary | ICD-10-CM | POA: Diagnosis not present

## 2022-06-29 NOTE — Patient Instructions (Signed)
Please continue using your CPAP regularly. While your insurance requires that you use CPAP at least 4 hours each night on 70% of the nights, I recommend, that you not skip any nights and use it throughout the night if you can. Getting used to CPAP and staying with the treatment long term does take time and patience and discipline. Untreated obstructive sleep apnea when it is moderate to severe can have an adverse impact on cardiovascular health and raise her risk for heart disease, arrhythmias, hypertension, congestive heart failure, stroke and diabetes. Untreated obstructive sleep apnea causes sleep disruption, nonrestorative sleep, and sleep deprivation. This can have an impact on your day to day functioning and cause daytime sleepiness and impairment of cognitive function, memory loss, mood disturbance, and problems focussing. Using CPAP regularly can improve these symptoms.   We will order repeat home sleep study to verify continued sleep apnea. Listen for a phone call from out office to schedule. Let me know if you do not hear back. We will order a new machine as soon as we get results back.    Follow up will be 31-90 days following set up of new machine.

## 2022-06-29 NOTE — Progress Notes (Signed)
PATIENT: [name] DOB: 06-25-1971  REASON FOR VISIT: follow up HISTORY FROM: patient  Chief Complaint  Patient presents with   Follow-up    Pt in room #2 and alone. pt here today for f/u CPAP.     HISTORY OF PRESENT ILLNESS:  06/29/22 ALL: Cassandra Barrera returns for follow up for OSA on CPAP. She continues to do well on therapy. She is using CPAP nightly for about 8 hours. She can not sleep without therapy. She denies concerns with CPAp machine or supplies. She was set up 09/2017 and wishes to get a new machine when eligible.   Compliance data dated 05/30/2022-06/28/2022 shows she used CPAP 30/30 days for greater than 4 hours, 100% compliance. Average usage was 7h 64m Residual AHI was 2.7/h on 12cmH20. No significant leak noted.   06/16/21 ALL (Mychart): Cassandra SCANTLINis a 51y.o. female here today for follow up for OSA on CPAP. She continues to do very well. She is using CPAP nightly for at least 4 hours. She reports that she can not sleep without it. No concerns with machine or supplies.    Compliance report dated 05/16/2021-06/14/2021 shows that she used CPAP 30/30 days for greater than 4 hours for complaince of 100%. Average usage was 7hr 5104m. Residual AHI was 1.6/hr on set pressure of 12cmH20. No significant leak noted.   06/10/2020 ALL: Cassandra STEEDMANs a [age].o. female here today for follow up for OSA on CPAP.  She is doing very well on CPAP therapy.  She reports that she cannot sleep without it.  She was notified of a recall.  She does not use an external cleanser.  No prolonged exposure to heat.  She has not noticed any black particles in her water filter.  She denies any concerns today.  Compliance report dated 05/10/2020 through 06/08/2020 reveals that she used CPAP 30 of the past 30 days for compliance of 100%.  She used CPAP greater than 4 hours all 30 days for compliance of 100%.  Average usage was 7 hours and 45 minutes.  Residual AHI was 0.7 on 12 cm of water.   There was no significant leak noted.  HISTORY: (copied from Dr AtGuadelupe Sabinote on 06/10/2020)  [name] a [age]ear old right-handed woman with an underlying medical history of anxiety, depression, hypothyroidism, allergies and morbid obesity with a BMI of over 5061who presents for follow-up consultation of her obstructive sleep apnea, well-established on CPAP therapy.  The patient is accompanied by her Mother-in-law today and presents for her yearly checkup.  I last saw her on 12/06/2017 after her sleep study testing and starting CPAP therapy.  She was compliant with treatment and doing well, felt much improved.   She saw CaCecille Rubinnurse practitioner in the interim on 06/08/2018, at which time she was compliant with treatment and advised to follow-up yearly.   Today, 06/11/2019: I reviewed her CPAP compliance data from 05/07/2019 through 06/05/2019 which is a total of 30 days, during which time she used her machine every night with percent use days greater than 4 hours at 100%, indicating superb compliance with an average usage of 7 hours and 24 minutes, residual AHI at goal at 0.7/h, leak low, pressure at 12 cm.  She reports that her CPAP is going well.  She uses nasal pillows, she is generally up-to-date with her supplies and sleeps well.  She works at [employer] older daughter is at [employer] her  younger one during remote learning, sophomore in high school.  She denies any acute illness or new medications.  Her weight may have increased since the pandemic she estimates.    The patient's allergies, current medications, family history, past medical history, past social history, past surgical history and problem list were reviewed and updated as appropriate.    Previously:    I first met her on 08/04/2017 at the request of her primary care provider, at which time she reported snoring, witnessed apneas as well as daytime somnolence. I asked her to proceed with a sleep study.  She had a split-night sleep study on 09/26/2017. I went over her test results with her in detail today. Baseline sleep efficiency was 81.9% with a sleep latency of 22 minutes and REM sleep was absent. She had an increased percentage of light stage sleep and absence of slow-wave sleep and REM sleep. She had moderate to loud snoring, total AHI was highly elevated at 165.3 per hour in the absence of REM sleep achieved. Overall oxygen saturation was 96% and nadir was in the severe range at 66% with significant time below 89% saturation of over 90 minutes. She was titrated with CPAP from 5 cm to 12 cm. She was fitted with nasal pillows and on the final titration pressure her AHI was 0 per hour with non-supine REM sleep achieved an O2 nadir of 90%. She had no significant PLMS during the study. She had no significant EKG changes during the study. She had a significant amount of REM rebound during the titration portion of the study. Based on her test results I prescribed CPAP therapy for home use at a pressure of 12 cm.   I reviewed her CPAP compliance data from 11/05/2017 through 12/04/2017 which is a total of 30 days, during which time she used her machine every night with percent used days greater than 4 hours at 93.3%, indicating excellent compliance with an average usage of 7 hours and 7 minutes which is also very good, residual AHI at goal at 0.8 per hour, leak very low, pressure at 12 cm.    08/04/2017: (She) reports snoring and excessive daytime somnolence as well as a family history of OSA. Her husband has witnessed apneas while she is asleep. I reviewed your office note from 07/28/2017. Her Epworth sleepiness score is 20 out of 24, fatigue score is 32 out of 63. She does not wake up rested. She wakes up with a sense of gasping for air. She lives with her husband and children. She has 2 daughters, ages 16 and 64. She works for [employer], part-time as a Licensed conveyancer, she also tutors math. She is a  nonsmoker and does not utilize alcohol and drinks caffeine in the form of soda, 2-3 per day.  She is usually in bed by 10 PM , she does not watch TV in bed. They do have a dog in the bedroom and sometimes on her bed. She has a wake time of around 7. She denies any telltale symptoms of restless leg syndrome or leg twitching at night. She has nocturia about once per night on average. She has come close to dozing off at work. She does not take any scheduled naps but has lately fallen asleep inadvertently when sedentary at home. She has gained some weight in the past few years. She has gradually become more sleepy during the day. Her father has obstructive sleep apnea and uses a CPAP machine.   REVIEW OF SYSTEMS: Out  of a complete 14 system review of symptoms, the patient complains only of the following symptoms, none and all other reviewed systems are negative.  ESS: 1 FSS: 18  ALLERGIES: Allergies  Allergen Reactions   Sulfa Antibiotics Other (See Comments)    Reaction: lightheaded    HOME MEDICATIONS: Outpatient Medications Prior to Visit  Medication Sig Dispense Refill   levothyroxine (SYNTHROID) 200 MCG tablet Take 1 tablet (200 mcg total) by mouth daily before breakfast. Take with 75mg for a total of 2563mdaily. 90 tablet 3   levothyroxine (SYNTHROID) 50 MCG tablet Take 1 tablet (50 mcg total) by mouth daily before breakfast. 90 tablet 3   loratadine (CLARITIN) 10 MG tablet Take 1 tablet (10 mg total) by mouth daily. 30 tablet 11   sertraline (ZOLOFT) 50 MG tablet TAKE 3 TABLETS(150 MG) BY MOUTH DAILY 270 tablet 3   No facility-administered medications prior to visit.    PAST MEDICAL HISTORY: Past Medical History:  Diagnosis Date   Anxiety    Depression    OSA on CPAP    Thyroid disease    underactive    PAST SURGICAL HISTORY: History reviewed. No pertinent surgical history.  FAMILY HISTORY: Family History  Problem Relation Age of Onset   Hypertension Mother     Diabetes Mother    Heart disease Mother    Diabetes Father     SOCIAL HISTORY: Social History   Socioeconomic History   Marital status: Married    Spouse name: Jose "JoFara Olden  Number of children: 2   Years of education: college   Highest education level: Not on file  Occupational History   Occupation: part-time maRisk analyst  Comment: [employer]   Smoking status: Never   Smokeless tobacco: Never  Substance and Sexual Activity   Alcohol use: No    Alcohol/week: 0.0 standard drinks of alcohol   Drug use: No   Sexual activity: Yes    Birth control/protection: I.U.D.  Other Topics Concern   Not on file  Social History Narrative   Lives with her husband and their 2 daughters.   Parents live in [place]  Her sister and her husband's family live in [place]  Social Determinants of Health   Financial Resource Strain: Not on file  Food Insecurity: Not on file  Transportation Needs: Not on file  Physical Activity: Not on file  Stress: Not on file  Social Connections: Not on file  Intimate Partner Violence: Not on file      PHYSICAL EXAM  Vitals:   06/29/22 1012  BP: (!) 145/88  Pulse: (!) 101  Weight: (!) 308 lb (139.7 kg)  Height: 5' (1.524 m)   Body mass index is 60.15 kg/m.  Neck cir: 17" Generalized: Well developed, in no acute distress  Cardiology: normal rate and rhythm, no murmur noted Respiratory: clear to auscultation bilaterally  Neurological examination  Mentation: Alert oriented to time, place, history taking. Follows all commands speech and language fluent Cranial nerve II-XII: Pupils were equal round reactive to light. Extraocular movements were full, visual field were full Motor: The motor testing reveals 5 over 5 strength of all 4 extremities. Good symmetric motor tone is noted throughout.  Gait and station: Gait is normal.    DIAGNOSTIC DATA (LABS, IMAGING, TESTING) - I reviewed patient records, labs, notes,  testing and imaging myself where available.      No data to display  Lab Results  Component Value Date   WBC 11.9 (H) 03/13/2019   HGB 13.7 03/13/2019   HCT 42.3 03/13/2019   MCV 89 03/13/2019   PLT 385 03/13/2019      Component Value Date/Time   NA 140 07/01/2020 1206   K 4.3 07/01/2020 1206   CL 101 07/01/2020 1206   CO2 25 07/01/2020 1206   GLUCOSE 99 07/01/2020 1206   GLUCOSE 69 (L) 03/06/2013 1301   BUN 12 07/01/2020 1206   CREATININE 0.61 07/01/2020 1206   CREATININE 0.53 03/06/2013 1301   CALCIUM 9.8 07/01/2020 1206   PROT 7.1 07/28/2017 1602   ALBUMIN 4.2 07/28/2017 1602   AST 16 07/28/2017 1602   ALT 13 07/28/2017 1602   ALKPHOS 62 07/28/2017 1602   BILITOT 0.2 07/28/2017 1602   GFRNONAA 108 07/01/2020 1206   GFRAA 125 07/01/2020 1206   Lab Results  Component Value Date   CHOL 172 07/01/2020   HDL 35 (L) 07/01/2020   LDLCALC 75 07/01/2020   TRIG 391 (H) 07/01/2020   CHOLHDL 4.9 (H) 07/01/2020   Lab Results  Component Value Date   HGBA1C 5.5 09/11/2019   No results found for: "VITAMINB12" Lab Results  Component Value Date   TSH 5.610 (H) 07/01/2020       ASSESSMENT AND PLAN 51 y.o. year old female  has a past medical history of Anxiety, Depression, OSA on CPAP, and Thyroid disease. here with     ICD-10-CM   1. OSA on CPAP  G47.33 For home use only DME continuous positive airway pressure (CPAP)    Home sleep test       Shawnta is doing very well with CPAP therapy.  Compliance report reveals excellent compliance.  She was encouraged to continue using CPAP nightly and for greater than 4 hours each night. She will be eligible for a new machine 09/2022. I will repeat HST and plan to order new machine at that time. Healthy lifestyle habits encouraged.  She will continue close follow-up with primary care.  She will follow-up with [place] 31-90 days following set up of new CPAP. She verbalizes understanding and agreement with this  plan.   Orders Placed This Encounter  Procedures   For home use only DME continuous positive airway pressure (CPAP)    Supplies    Order Specific Question:   Length of Need    Answer:   Lifetime    Order Specific Question:   Patient has OSA or probable OSA    Answer:   Yes    Order Specific Question:   Is the patient currently using CPAP in the home    Answer:   Yes    Order Specific Question:   Settings    Answer:   Other see comments    Order Specific Question:   CPAP supplies needed    Answer:   Mask, headgear, cushions, filters, heated tubing and water chamber   Home sleep test    Standing Status:   Future    Standing Expiration Date:   06/30/2023    Order Specific Question:   Where should this test be performed:    Answer:   [facility]     No orders of the defined types were placed in this encounter.     Debbora Presto, FNP-C 06/29/2022, 10:34 AM [facility] [address] [place], [place] [address] [phone]

## 2022-07-05 ENCOUNTER — Telehealth: Payer: Self-pay | Admitting: Family Medicine

## 2022-07-05 NOTE — Telephone Encounter (Signed)
HST- [employer]BS state no auth req.  Patient is scheduled at [facility] for 07/27/22 at 3:30 pm.  Mailed packet to the patient.

## 2022-07-27 ENCOUNTER — Ambulatory Visit: Payer: BC Managed Care – PPO | Admitting: Neurology

## 2022-07-27 DIAGNOSIS — G4733 Obstructive sleep apnea (adult) (pediatric): Secondary | ICD-10-CM

## 2022-07-27 DIAGNOSIS — G4734 Idiopathic sleep related nonobstructive alveolar hypoventilation: Secondary | ICD-10-CM

## 2022-07-29 NOTE — Progress Notes (Signed)
See Procedure note.

## 2022-08-02 NOTE — Procedures (Signed)
[facility]  HOME SLEEP TEST (Watch PAT) REPORT  STUDY DATE: 07/27/2022  DOB: 10/07/1970  MRN: [id number]  ORDERING CLINICIAN: Huston Foley, MD, PhD   REFERRING CLINICIAN: Shawnie Dapper, NP  CLINICAL INFORMATION/HISTORY: 51 year old female with a history of anxiety, depression, hypothyroidism, allergies and morbid obesity with a BMI of over 60, who presents for reevaluation of her obstructive sleep apnea.  She has been compliant with her CPAP of 12 cm, she should be eligible for a new machine soon.    BMI: 60.6 kg/m  FINDINGS:   Sleep Summary:   Total Recording Time (hours, min): 7 hours, 31 min  Total Sleep Time (hours, min):  6 hours, 5 min  Percent REM (%):    0%   Respiratory Indices:   Calculated pAHI (per hour):  109.4/hour         REM pAHI:    n/a       NREM pAHI: 109.4/hour  Central pAHI: 0/hour  Oxygen Saturation Statistics:    Oxygen Saturation (%) Mean: 92%   Minimum oxygen saturation (%):                 81%   O2 Saturation Range (%): 81 - 99%    O2 Saturation (minutes) <=88%: 44.5 min  Pulse Rate Statistics:   Pulse Mean (bpm):    67/min    Pulse Range (40 - 125/min)   IMPRESSION: OSA (obstructive sleep apnea), severe Nocturnal Hypoxemia  RECOMMENDATION:  This home sleep test re-demonstrates severe obstructive sleep apnea with a total AHI of 109.4/hour and O2 nadir of 81%.  REM sleep was not detected by home sleep test equipment. There was evidence of nocturnal hypoxemia with time below or at 88% saturation of over 40 minutes.  Snoring was in the moderate to loud range fairly consistently throughout the test.  Ongoing treatment with positive airway pressure is highly recommended. The patient has been compliant with CPAP of 12 cm with good apnea control per download.  She should be eligible for a new machine, I recommend ongoing treatment with a CPAP of 12 cm with EPR of 3 or as per patient preference and mask of choice.  Ongoing  full compliance is recommended with treatment.  A laboratory attended titration study can be considered in the future for optimization of his treatment and better tolerance of therapy.  Alternative treatment options are limited secondary to the severity of the patient's sleep disordered breathing, but may include surgical treatment with Inspire, hypoglossal nerve stimulator, in carefully selected candidates (meeting criteria).  Concomitant weight loss is recommended, where clinically appropriate. Please note, that untreated obstructive sleep apnea may carry additional perioperative morbidity. Patients with significant obstructive sleep apnea should receive perioperative PAP therapy and the surgeons and particularly the anesthesiologist should be informed of the diagnosis and the severity of the sleep disordered breathing. The patient should be cautioned not to drive, work at heights, or operate dangerous or heavy equipment when tired or sleepy. Review and reiteration of good sleep hygiene measures should be pursued with any patient. Other causes of the patient's symptoms, including circadian rhythm disturbances, an underlying mood disorder, medication effect and/or an underlying medical problem cannot be ruled out based on this test. Clinical correlation is recommended.  The patient and her referring provider will be notified of the test results. The patient will be seen in follow up in sleep clinic at [facility].  I certify that I have reviewed the raw data recording prior to  the issuance of this report in accordance with the standards of the American Academy of Sleep Medicine (AASM).  INTERPRETING PHYSICIAN:   Huston Foley, MD, PhD  Board Certified in Neurology and Sleep Medicine  [facility] [address] [place], [place] [address] [phone]

## 2022-08-03 ENCOUNTER — Other Ambulatory Visit: Payer: Self-pay | Admitting: Family Medicine

## 2022-08-03 DIAGNOSIS — G4733 Obstructive sleep apnea (adult) (pediatric): Secondary | ICD-10-CM

## 2022-08-04 ENCOUNTER — Telehealth: Payer: Self-pay

## 2022-10-21 ENCOUNTER — Encounter: Payer: Self-pay | Admitting: Family Medicine

## 2022-11-02 NOTE — Progress Notes (Unsigned)
   PATIENT: KIMBERLYNN LUMBRA DOB: 12/30/1970  REASON FOR VISIT: follow up HISTORY FROM: patient  Virtual Visit via Telephone Note  I connected with London Pepper on 11/03/22 at  8:45 AM EST by telephone and verified that I am speaking with the correct person using two identifiers.   I discussed the limitations, risks, security and privacy concerns of performing an evaluation and management service by telephone and the availability of in person appointments. I also discussed with the patient that there may be a patient responsible charge related to this service. The patient expressed understanding and agreed to proceed.   History of Present Illness:  11/03/22 ALL (Mychart): JAIMIE PIPPINS is a 52 y.o. female here today for follow up for OSA on CPAP. She is doing well with her new machine. Set up 07/2022. She is using therapy nightly for about 7-8 hours. She denies concerns with machine or supplies.     06/29/22 ALL: Mahaila returns for follow up for OSA on CPAP. She continues to do well on therapy. She is using CPAP nightly for about 8 hours. She can not sleep without therapy. She denies concerns with CPAp machine or supplies. She was set up 09/2017 and wishes to get a new machine when eligible.   Compliance data dated 05/30/2022-06/28/2022 shows she used CPAP 30/30 days for greater than 4 hours, 100% compliance. Average usage was 7h 91m. Residual AHI was 2.7/h on 12cmH20. No significant leak noted.    Observations/Objective:  Generalized: Well developed, in no acute distress  Mentation: Alert oriented to time, place, history taking. Follows all commands speech and language fluent   Assessment and Plan:  52 y.o. year old female  has a past medical history of Anxiety, Depression, OSA on CPAP, and Thyroid disease. here with    ICD-10-CM   1. OSA on CPAP  G47.33       Ilona is doing well, today. Compliance report shows excellent daily and four hour compliance. She was  encouraged to continue therapy nightly for at least 4 hours. She will follow up with me in 1 year.   No orders of the defined types were placed in this encounter.   No orders of the defined types were placed in this encounter.    Follow Up Instructions:  I discussed the assessment and treatment plan with the patient. The patient was provided an opportunity to ask questions and all were answered. The patient agreed with the plan and demonstrated an understanding of the instructions.   The patient was advised to call back or seek an in-person evaluation if the symptoms worsen or if the condition fails to improve as anticipated.  I provided 15 minutes of non-face-to-face time during this encounter. Patient located at their place of residence during [place] visit. Provider is in the office.    Debbora Presto, NP

## 2022-11-03 ENCOUNTER — Encounter: Payer: Self-pay | Admitting: Family Medicine

## 2022-11-03 ENCOUNTER — Telehealth (INDEPENDENT_AMBULATORY_CARE_PROVIDER_SITE_OTHER): Payer: BC Managed Care – PPO | Admitting: Family Medicine

## 2022-11-03 DIAGNOSIS — G4733 Obstructive sleep apnea (adult) (pediatric): Secondary | ICD-10-CM

## 2022-11-03 NOTE — Patient Instructions (Signed)

## 2022-12-28 ENCOUNTER — Encounter: Payer: Self-pay | Admitting: Adult Health

## 2022-12-28 ENCOUNTER — Ambulatory Visit: Payer: BC Managed Care – PPO | Admitting: Adult Health

## 2022-12-28 DIAGNOSIS — F411 Generalized anxiety disorder: Secondary | ICD-10-CM | POA: Diagnosis not present

## 2022-12-28 DIAGNOSIS — F331 Major depressive disorder, recurrent, moderate: Secondary | ICD-10-CM | POA: Diagnosis not present

## 2022-12-28 MED ORDER — SERTRALINE HCL 100 MG PO TABS: ORAL_TABLET | ORAL | 3 refills | Status: DC

## 2022-12-28 NOTE — Progress Notes (Signed)
Cassandra Barrera [id number] 1971-09-06 52 y.o.  Subjective:   Patient ID:  Cassandra Barrera is a 52 y.o. (DOB [birth year]) female.  Chief Complaint: No chief complaint on file.   HPI Cassandra Barrera presents to the office today for follow-up of MDD and GAD.  Describes mood today as "ok". Pleasant. Denies tearfulness. Mood symptoms - reports depression, anxiety, and irritability "sometimes". Denies worry, rumination, and over thinking. Mood is consistent. Stating "I think generally I am doing ok". Currently taking Zoloft 150mg  daily and feels it works well. Stable interest and motivation. Taking medications as prescribed.  Energy levels normal. Active, does not have a regular exercise routine.  Enjoys some usual interests and activities. Married. Lives with husband - 2 daughters - 22 senior in college - 37 freshman in college - both at [employer]. Mother in law with Alzheimer's - Spending time with family. Appetite adequate. Weight stable. Sleeps well most nights. Averages 8 hours - uses CPAP nightly. Focus and concentration stable. Completing tasks. Managing aspects of household. 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 Denies SI or HI.  Denies AH or VH. Denies self harm. Denies substance use.  Previous medication trials:  Zoloft, Paxil, Effexor   GAD-7    Flowsheet Row Video Visit from 12/11/2019 in Primary Care at [facility]  Total GAD-7 Score 0      PHQ2-9    Flowsheet Row Office Visit from 07/01/2020 in Primary Care at [facility] Video Visit from 12/11/2019 in Primary Care at [facility] Visit from 09/11/2019 in Primary Care at [facility] Visit from 03/13/2019 in Primary Care at [facility] Visit from 04/20/2018 in Primary Care at [facility] Total Score 0 0 0 0 0  PHQ-9 Total Score -- -- -- 0 --        Review of Systems:  Review of Systems  Musculoskeletal:  Negative for gait problem.  Neurological:  Negative for tremors.  Psychiatric/Behavioral:         Please refer to  HPI    Medications: I have reviewed the patient's current medications.  Current Outpatient Medications  Medication Sig Dispense Refill   levothyroxine (SYNTHROID) 200 MCG tablet Take 1 tablet (200 mcg total) by mouth daily before breakfast. Take with for a total of 250mg  daily. 90 tablet 3   levothyroxine (SYNTHROID) 50 MCG tablet Take 1 tablet (50 mcg total) by mouth daily before breakfast. 90 tablet 3   loratadine (CLARITIN) 10 MG tablet Take 1 tablet (10 mg total) by mouth daily. 30 tablet 11   sertraline (ZOLOFT) 100 MG tablet TAKE 2 TABLETS BY MOUTH DAILY 180 tablet 3   No current facility-administered medications for this visit.    Medication Side Effects: None  Allergies:  Allergies  Allergen Reactions   Sulfa Antibiotics Other (See Comments)    Reaction: lightheaded    Past Medical History:  Diagnosis Date   Anxiety    Depression    OSA on CPAP    Thyroid disease    underactive    Past Medical History, Surgical history, Social history, and Family history were reviewed and updated as appropriate.   Please see review of systems for further details on the patient's review from today.   Objective:   Physical Exam:  There were no vitals taken for this visit.  Physical Exam Constitutional:      General: She is not in acute distress. Musculoskeletal:        General: No deformity.  Neurological:     Mental Status: She is alert and oriented to person, place, and time.     Coordination: Coordination normal.  Psychiatric:        Attention and Perception: Attention and perception normal. She does not perceive auditory or visual hallucinations.        Mood and Affect: Mood normal. Mood is not anxious or depressed. Affect is not labile, blunt, angry or inappropriate.        Speech: Speech normal.        Behavior: Behavior normal.        Thought Content: Thought content normal. Thought content is not paranoid or delusional. Thought content does not include  homicidal or suicidal ideation. Thought content does not include homicidal or suicidal plan.        Cognition and Memory: Cognition and memory normal.        Judgment: Judgment normal.     Comments: Insight intact     Lab Review:     Component Value Date/Time   NA 140 07/01/2020 1206   K 4.3 07/01/2020 1206   CL 101 07/01/2020 1206   CO2 25 07/01/2020 1206   GLUCOSE 99 07/01/2020 1206   GLUCOSE 69 (L) 03/06/2013 1301   BUN 12 07/01/2020 1206   CREATININE 0.61 07/01/2020 1206   CREATININE 0.53 03/06/2013 1301   CALCIUM 9.8 07/01/2020 1206   PROT 7.1 07/28/2017 1602   ALBUMIN 4.2 07/28/2017 1602   AST 16 07/28/2017 1602   ALT 13 07/28/2017 1602   ALKPHOS 62 07/28/2017 1602   BILITOT 0.2 07/28/2017 1602   GFRNONAA 108 07/01/2020 1206   GFRAA 125 07/01/2020 1206       Component Value Date/Time   WBC 11.9 (H) 03/13/2019 1602   WBC 10.6 (H) 03/06/2013 1301   RBC 4.76 03/13/2019 1602   RBC 4.74 03/06/2013 1301   HGB 13.7 03/13/2019 1602   HCT 42.3 03/13/2019 1602   PLT 385 03/13/2019 1602   MCV 89 03/13/2019 1602   MCH 28.8 03/13/2019 1602   MCH 29.5 03/06/2013 1301   MCHC 32.4 03/13/2019 1602   MCHC 33.7 03/06/2013 1301   RDW 13.2 03/13/2019 1602   LYMPHSABS 2.5 07/28/2017 1602   MONOABS 0.7 03/06/2013 1301   EOSABS 0.5 (H) 07/28/2017 1602   BASOSABS 0.0 07/28/2017 1602    No results found for: "POCLITH", "LITHIUM"   No results found for: "PHENYTOIN", "PHENOBARB", "VALPROATE", "CBMZ"   .res Assessment: Plan:    Plan:  PDMP reviewed  Zoloft 150mg  daily  RTC 1 year  Patient advised to contact office with any questions, adverse effects, or acute worsening in signs and symptoms.  Please see After Visit Summary for patient specific instructions.  Future Appointments  Date Time Provider Department Center  11/08/2023  8:45 AM Lomax, Amy, NP [facility] None    No orders of the defined types were placed in this encounter.   -------------------------------

## 2023-03-09 ENCOUNTER — Ambulatory Visit: Payer: BC Managed Care – PPO | Admitting: Family

## 2023-04-06 ENCOUNTER — Ambulatory Visit: Payer: BC Managed Care – PPO | Admitting: Family

## 2023-04-06 ENCOUNTER — Encounter: Payer: Self-pay | Admitting: Family

## 2023-04-06 VITALS — BP 134/76 | HR 80 | Temp 98.7°F | Resp 16 | Ht 60.5 in | Wt 297.0 lb

## 2023-04-06 DIAGNOSIS — E039 Hypothyroidism, unspecified: Secondary | ICD-10-CM

## 2023-04-06 DIAGNOSIS — F419 Anxiety disorder, unspecified: Secondary | ICD-10-CM

## 2023-04-06 DIAGNOSIS — Z1231 Encounter for screening mammogram for malignant neoplasm of breast: Secondary | ICD-10-CM

## 2023-04-06 DIAGNOSIS — Z1159 Encounter for screening for other viral diseases: Secondary | ICD-10-CM

## 2023-04-06 DIAGNOSIS — Z833 Family history of diabetes mellitus: Secondary | ICD-10-CM | POA: Diagnosis not present

## 2023-04-06 DIAGNOSIS — E559 Vitamin D deficiency, unspecified: Secondary | ICD-10-CM

## 2023-04-06 DIAGNOSIS — Z114 Encounter for screening for human immunodeficiency virus [HIV]: Secondary | ICD-10-CM

## 2023-04-06 DIAGNOSIS — Z1211 Encounter for screening for malignant neoplasm of colon: Secondary | ICD-10-CM

## 2023-04-06 DIAGNOSIS — Z309 Encounter for contraceptive management, unspecified: Secondary | ICD-10-CM

## 2023-04-06 DIAGNOSIS — Z23 Encounter for immunization: Secondary | ICD-10-CM

## 2023-04-06 DIAGNOSIS — Z0000 Encounter for general adult medical examination without abnormal findings: Secondary | ICD-10-CM

## 2023-04-06 DIAGNOSIS — E781 Pure hyperglyceridemia: Secondary | ICD-10-CM

## 2023-04-06 DIAGNOSIS — Z124 Encounter for screening for malignant neoplasm of cervix: Secondary | ICD-10-CM

## 2023-04-06 DIAGNOSIS — G4733 Obstructive sleep apnea (adult) (pediatric): Secondary | ICD-10-CM

## 2023-04-06 DIAGNOSIS — F32A Depression, unspecified: Secondary | ICD-10-CM

## 2023-04-06 LAB — HEMOGLOBIN A1C: Hgb A1c MFr Bld: 6 % (ref 4.6–6.5)

## 2023-04-06 LAB — LDL CHOLESTEROL, DIRECT: Direct LDL: 110 mg/dL

## 2023-04-06 LAB — COMPREHENSIVE METABOLIC PANEL
ALT: 24 U/L (ref 0–35)
AST: 18 U/L (ref 0–37)
Albumin: 4.5 g/dL (ref 3.5–5.2)
Alkaline Phosphatase: 61 U/L (ref 39–117)
BUN: 18 mg/dL (ref 6–23)
CO2: 29 mEq/L (ref 19–32)
Calcium: 10.1 mg/dL (ref 8.4–10.5)
Chloride: 100 mEq/L (ref 96–112)
Creatinine, Ser: 0.61 mg/dL (ref 0.40–1.20)
GFR: 104.17 mL/min (ref >60.00)
Glucose, Bld: 84 mg/dL (ref 70–99)
Potassium: 4.3 mEq/L (ref 3.5–5.1)
Sodium: 139 mEq/L (ref 135–145)
Total Bilirubin: 0.5 mg/dL (ref 0.2–1.2)
Total Protein: 7.7 g/dL (ref 6.0–8.3)

## 2023-04-06 LAB — T3, FREE: T3, Free: 4.3 pg/mL — ABNORMAL HIGH (ref 2.3–4.2)

## 2023-04-06 LAB — LIPID PANEL
Cholesterol: 181 mg/dL (ref 0–200)
HDL: 43.7 mg/dL (ref >39.00)
NonHDL: 136.99
Total CHOL/HDL Ratio: 4
Triglycerides: 305 mg/dL — ABNORMAL HIGH (ref 0.0–149.0)
VLDL: 61 mg/dL — ABNORMAL HIGH (ref 0.0–40.0)

## 2023-04-06 LAB — VITAMIN D 25 HYDROXY (VIT D DEFICIENCY, FRACTURES): VITD: 24.3 ng/mL — ABNORMAL LOW (ref 30.00–100.00)

## 2023-04-06 LAB — TSH: TSH: 29.6 u[IU]/mL — ABNORMAL HIGH (ref 0.35–5.50)

## 2023-04-06 LAB — T4, FREE: Free T4: 0.3 ng/dL — ABNORMAL LOW (ref 0.60–1.60)

## 2023-04-06 NOTE — Patient Instructions (Addendum)
VISIT SUMMARY:  During your recent visit, we discussed your ongoing management of anxiety, hypothyroidism, high triglyerides and sleep apnea. We also addressed your concerns about occasional leg swelling, sciatica, and menstrual irregularities due to your Mirena IUD. We discussed your interest in weight loss assistance and your family history of diabetes. We also reviewed your general health maintenance needs, including the need for a colonoscopy, mammogram, and vaccinations.  YOUR PLAN:  -ANXIETY: Your anxiety is well-controlled with your current medication, Sertraline. Please continue taking it as prescribed.  -HYPOTHYROIDISM: You've been off your thyroid medication, Synthroid, for a while. We'll check your thyroid levels and if they're low, we'll refill your prescription.  -SLEEP APNEA: You're managing your sleep apnea well with your CPAP machine. Please continue using it every night.  -MENSTRUAL IRREGULARITIES: You've had your Mirena IUD for over 5 years and have noticed some spotting. We'll refer you to a gynecologist for IUD removal and evaluation.  -GENERAL HEALTH MAINTENANCE: We'll schedule a colonoscopy and mammogram for cancer screening, administer vaccines for shingles and tetanus, add Hepatitis C and HIV screening to your labs, and refer you to the [facility] for a diet and exercise plan.  -HYPERTRIGLYCERIDEMIA: You have a history of high triglycerides. We'll check your lipid panel and advise you on dietary modifications to help manage this.  -VITAMIN D DEFICIENCY: You have a history of low Vitamin D levels. We'll check your Vitamin D levels during your lab tests.  -DIABETES RISK: Given your family history of diabetes, we'll check your A1C levels if your blood sugar is elevated.  INSTRUCTIONS:  Please continue your current medications and treatments as directed. We'll be scheduling several tests and screenings for you, including a colonoscopy, mammogram,  and various lab tests. You'll also be referred to a gynecologist and the [facility]. Please make sure to follow up on these referrals and appointments.

## 2023-04-06 NOTE — Assessment & Plan Note (Signed)
Stable on sertraline which is being prescribed by Psych.

## 2023-04-06 NOTE — Progress Notes (Signed)
Subjective:     Patient ID: Cassandra Barrera, female    DOB: Dec 07, 1970, 52 y.o.   MRN: [id number]  Chief Complaint  Patient presents with   New Patient (Initial Visit)    Last seen at [facility] 2021    HPI  Discussed the use of AI scribe software for clinical note transcription with the patient, who gave verbal consent to proceed.  History of Present Illness   The patient, with a history of anxiety, hypothyroidism, and sleep apnea, presents today to establish care.  She has not seen a PCP in approximately 5 years.  She has been managing her anxiety with sertraline 200mg  daily for over twenty years and report good control of symptoms. She has not been taking Synthroid for her hypothyroidism for some time due to other life stressors and do not report any significant changes in how she feel off the medication. She has been compliant with CPAP use for her sleep apnea for over five years.  The patient also reports occasional swelling in her legs and a history of sciatica that flares up intermittently. She has a Mirena IUD in place for over 5 years (pt is not sure exactly how old it is) and report occasional spotting. She has not had a colonoscopy or recent mammogram and are due for both.  The patient has been trying to improve her diet and has lost about 10 pounds. She report her exercise is not great and she are interested in weight loss assistance. She has a family history of diabetes and high triglycerides.       Health Maintenance Due  Topic Date Due   PAP SMEAR-Modifier  Never done   Colonoscopy  Never done   COVID-19 Vaccine (3 - 2023-24 season) 05/21/2022   MAMMOGRAM  Never done    Past Medical History:  Diagnosis Date   Anxiety    Depression    OSA on CPAP    Thyroid disease    underactive    Past Surgical History:  Procedure Laterality Date   MOUTH SURGERY     wisdom teeth removed    Family History  Problem Relation Age of Onset   Hypertension Mother     Diabetes Mother    Heart disease Mother    Diabetes Father    Anxiety disorder Sister    Hypothyroidism Maternal Grandmother    Hypertension Maternal Grandmother    Heart disease Maternal Grandmother    Kidney failure Maternal Grandmother        solitary kidney   Cancer Maternal Grandfather        bladder- in his 54's   Diabetes Mellitus II Paternal Grandfather    CVA Paternal Grandfather    Anxiety disorder Daughter    Anxiety disorder Daughter     Social History   Socioeconomic History   Marital status: Married    Spouse name: Jose "Francis Dowse"   Number of children: 2   Years of education: college   Highest education level: Not on file  Occupational History   Occupation: part-time Designer, multimedia    Comment: [employer]  Tobacco Use   Smoking status: Never   Smokeless tobacco: Never  Substance and Sexual Activity   Alcohol use: No    Alcohol/week: 0.0 standard drinks of alcohol   Drug use: No   Sexual activity: Yes    Birth control/protection: I.U.D.  Other Topics Concern   Not on file  Social History Narrative   Lives with her  husband    2 daughters- one grad college, one in college   Her sister and her husband's family live in [place]   Professional math tutor at [employer]   Completed bachelor's degree   Enjoys reading, Clinical cytogeneticist, travel      Social Determinants of Corporate investment banker Strain: Not on [employer] Insecurity: Not on file  Transportation Needs: Not on file  Physical Activity: Not on file  Stress: Not on file  Social Connections: Not on file  Intimate Partner Violence: Not on file    Outpatient Medications Prior to Visit  Medication Sig Dispense Refill   loratadine (CLARITIN) 10 MG tablet Take 1 tablet (10 mg total) by mouth daily. 30 tablet 11   sertraline (ZOLOFT) 100 MG tablet TAKE 2 TABLETS BY MOUTH DAILY 180 tablet 3   levothyroxine (SYNTHROID) 200 MCG tablet Take 1 tablet (200 mcg total) by mouth daily before breakfast.  Take with for a total of 250mg  daily. (Patient not taking: Reported on 04/06/2023) 90 tablet 3   levothyroxine (SYNTHROID) 50 MCG tablet Take 1 tablet (50 mcg total) by mouth daily before breakfast. (Patient not taking: Reported on 04/06/2023) 90 tablet 3   No facility-administered medications prior to visit.    Allergies  Allergen Reactions   Sulfa Antibiotics Other (See Comments)    Reaction: lightheaded    Review of Systems  Constitutional:  Positive for weight loss (10 pounds).  HENT:  Negative for congestion and hearing loss.   Eyes:  Negative for blurred vision.  Cardiovascular:  Negative for leg swelling.  Gastrointestinal:  Negative for constipation and diarrhea.  Genitourinary:  Negative for dysuria and frequency.  Musculoskeletal:  Negative for joint pain and myalgias.  Skin:  Negative for rash.  Neurological:  Negative for tingling.  Psychiatric/Behavioral:         See HPI       Objective:    Physical Exam   BP 134/76   Pulse 80   Temp 98.7 F (37.1 C) (Oral)   Resp 16   Ht 5' 0.5" (1.537 m)   Wt 297 lb (134.7 kg)   SpO2 98%   BMI 57.05 kg/m  Wt Readings from Last 3 Encounters:  04/06/23 297 lb (134.7 kg)  06/29/22 (!) 308 lb (139.7 kg)  07/01/20 296 lb (134.3 kg)  Physical Exam  Constitutional: She is oriented to person, place, and time. She appears well-developed and well-nourished. No distress.  HENT:  Head: Normocephalic and atraumatic.  Right Ear: Tympanic membrane and ear canal normal.  Left Ear: Tympanic membrane and ear canal normal.  Mouth/Throat: Oropharynx is clear and moist.  Eyes: Pupils are equal, round, and reactive to light. No scleral icterus.  Neck: Normal range of motion. No thyromegaly present.  Cardiovascular: Normal rate and regular rhythm.   No murmur heard. Pulmonary/Chest: Effort normal and breath sounds normal. No respiratory distress. He has no wheezes. She has no rales. She exhibits no tenderness.  Abdominal: Soft.  Bowel sounds are normal. She exhibits no distension and no mass. There is no tenderness. There is no rebound and no guarding.  Musculoskeletal: She exhibits no edema.  Lymphadenopathy:    She has no cervical adenopathy.  Neurological: She is alert and oriented to person, place, and time. She exhibits normal muscle tone. Coordination normal.  Skin: Skin is warm and dry.  Psychiatric: She has a normal mood and affect. Her behavior is normal. Judgment and thought content normal.  Breast/pelvic: deferred  Assessment & Plan:        Assessment & Plan:   Problem List Items Addressed This Visit       Unprioritized   Vitamin D deficiency    Vit D slightly low, add vit D 2000 international units once daily otc.       Relevant Orders   VITAMIN D 25 Hydroxy (Vit-D Deficiency, Fractures) (Completed)   Preventative health care - Primary    General Health Maintenance: -Order colonoscopy for colorectal cancer screening. -Schedule mammogram for breast cancer screening. -Administer first dose of shingles vaccine today, with second dose in 2-6 months. -Administer tetanus vaccine today. -Add Hepatitis C and HIV screening to labs today. -[facility]er to [facility] for diet and exercise plan.      Obstructive sleep apnea on CPAP     Sleep Apnea: Managed with CPAP for over 5 years. Regular follow-up with Dr. Frances Furbish. -Continue CPAP use nightly.      Hypothyroidism    TSH elevated, resume levothyroxine at 50 mcg daily and repeat TFT's in 6 weeks.       Relevant Orders   TSH (Completed)   T3, free (Completed)   T4, free (Completed)   Hypertriglyceridemia    Lab Results  Component Value Date   CHOL 181 04/06/2023   HDL 43.70 04/06/2023   LDLCALC 75 07/01/2020   LDLDIRECT 110.0 04/06/2023   TRIG 305.0 (H) 04/06/2023   CHOLHDL 4 04/06/2023   Discussed necessary dietary changes.       Relevant Orders   Lipid panel (Completed)   Comp Met (CMET)  (Completed)   Encounter for contraceptive management     Mirena IUD in place for over 5 years. Some spotting noted. -Refer to gynecology for IUD removal and evaluation.      Anxiety and depression    Stable on sertraline which is being prescribed by Psych.        Other Visit Diagnoses     Screening for cervical cancer       Relevant Orders   Ambulatory referral to Obstetrics / Gynecology   Screening for colon cancer       Relevant Orders   Ambulatory referral to Gastroenterology   Breast cancer screening by mammogram       Relevant Orders   MM 3D SCREENING MAMMOGRAM BILATERAL BREAST   Encounter for screening for HIV       Relevant Orders   HIV antibody (with reflex) (Completed)   Encounter for hepatitis C screening test for low risk patient       Relevant Orders   Hepatitis C Antibody (Completed)   Morbid obesity (HCC)       Relevant Orders   Amb Ref to Medical Weight Management   Family history of diabetes mellitus       Relevant Orders   HgB A1c (Completed)   Need for Tdap vaccination       Relevant Orders   Tdap vaccine greater than or equal to 7yo IM (Completed)   Need for shingles vaccine       Relevant Orders   Zoster Recombinant (Shingrix ) (Completed)       I am having Zannie Kehr. Seiser maintain her loratadine and sertraline.  No orders of the defined types were placed in this encounter.

## 2023-04-07 ENCOUNTER — Telehealth: Payer: Self-pay | Admitting: Family

## 2023-04-07 ENCOUNTER — Telehealth: Payer: Self-pay

## 2023-04-07 DIAGNOSIS — Z1211 Encounter for screening for malignant neoplasm of colon: Secondary | ICD-10-CM | POA: Insufficient documentation

## 2023-04-07 DIAGNOSIS — E559 Vitamin D deficiency, unspecified: Secondary | ICD-10-CM

## 2023-04-07 DIAGNOSIS — Z309 Encounter for contraceptive management, unspecified: Secondary | ICD-10-CM | POA: Insufficient documentation

## 2023-04-07 DIAGNOSIS — E039 Hypothyroidism, unspecified: Secondary | ICD-10-CM

## 2023-04-07 DIAGNOSIS — E781 Pure hyperglyceridemia: Secondary | ICD-10-CM | POA: Insufficient documentation

## 2023-04-07 DIAGNOSIS — Z0000 Encounter for general adult medical examination without abnormal findings: Secondary | ICD-10-CM | POA: Insufficient documentation

## 2023-04-07 LAB — HIV ANTIBODY (ROUTINE TESTING W REFLEX): HIV 1&2 Ab, 4th Generation: NONREACTIVE

## 2023-04-07 LAB — HEPATITIS C ANTIBODY: Hepatitis C Ab: NONREACTIVE

## 2023-04-07 MED ORDER — LEVOTHYROXINE SODIUM 50 MCG PO TABS
50.0000 ug | ORAL_TABLET | Freq: Every day | ORAL | 3 refills | Status: DC
Start: 2023-04-07 — End: 2023-05-18

## 2023-04-07 MED ORDER — VITAMIN D3 50 MCG (2000 UT) PO CAPS
2000.0000 [IU] | ORAL_CAPSULE | Freq: Every day | ORAL | Status: AC
Start: 2023-04-07 — End: ?

## 2023-04-07 NOTE — Telephone Encounter (Signed)
Lab work shows borderline diabetes with A1C of 6.   6.5 is considered diabetes.  Please work on diet, exercise and weight loss as we discussed at her visit.  Thyroid studies show that her thyroid is underactive.  I would recommend that she start synthroid 50 mcg once daily and repeat TSH in 6 weeks.   Vitamin D level is low.  Advise patient to begin vit D 2000 international units once daily.

## 2023-04-07 NOTE — Assessment & Plan Note (Signed)
General Health Maintenance: -Order colonoscopy for colorectal cancer screening. -Schedule mammogram for breast cancer screening. -Administer first dose of shingles vaccine today, with second dose in 2-6 months. -Administer tetanus vaccine today. -Add Hepatitis C and HIV screening to labs today. -Refer to [facility] for diet and exercise plan.

## 2023-04-07 NOTE — Assessment & Plan Note (Signed)
  Sleep Apnea: Managed with CPAP for over 5 years. Regular follow-up with Dr. Frances Furbish. -Continue CPAP use nightly.

## 2023-04-07 NOTE — Assessment & Plan Note (Signed)
Lab Results  Component Value Date   CHOL 181 04/06/2023   HDL 43.70 04/06/2023   LDLCALC 75 07/01/2020   LDLDIRECT 110.0 04/06/2023   TRIG 305.0 (H) 04/06/2023   CHOLHDL 4 04/06/2023   Discussed necessary dietary changes.

## 2023-04-07 NOTE — Assessment & Plan Note (Signed)
TSH elevated, resume levothyroxine at 50 mcg daily and repeat TFT's in 6 weeks.

## 2023-04-07 NOTE — Telephone Encounter (Signed)
Called patient to schedule new patient appointment. Left voicemail with our contact information to call back and schedule.  

## 2023-04-07 NOTE — Assessment & Plan Note (Signed)
Vit D slightly low, add vit D 2000 international units once daily otc.

## 2023-04-07 NOTE — Assessment & Plan Note (Signed)
Mirena IUD in place for over 5 years. Some spotting noted. -Refer to gynecology for IUD removal and evaluation.

## 2023-04-08 ENCOUNTER — Telehealth (HOSPITAL_BASED_OUTPATIENT_CLINIC_OR_DEPARTMENT_OTHER): Payer: Self-pay

## 2023-04-08 NOTE — Telephone Encounter (Signed)
Patient notified of results, new rx, provider comments and recommendations. She verbalized understanding. Was scheduled for labs in 6 weeks.

## 2023-04-08 NOTE — Telephone Encounter (Signed)
Called patient but no answer, left voice mail for patient to call back.   

## 2023-04-12 ENCOUNTER — Encounter (HOSPITAL_BASED_OUTPATIENT_CLINIC_OR_DEPARTMENT_OTHER): Payer: Self-pay

## 2023-04-12 ENCOUNTER — Ambulatory Visit (HOSPITAL_BASED_OUTPATIENT_CLINIC_OR_DEPARTMENT_OTHER)
Admission: RE | Admit: 2023-04-12 | Discharge: 2023-04-12 | Disposition: A | Discharge status: Home / Self Care | Payer: BC Managed Care – PPO | Source: Ambulatory Visit | Attending: Family | Admitting: Family

## 2023-04-12 DIAGNOSIS — Z1231 Encounter for screening mammogram for malignant neoplasm of breast: Secondary | ICD-10-CM | POA: Insufficient documentation

## 2023-04-18 ENCOUNTER — Encounter: Payer: Self-pay | Admitting: Gastroenterology

## 2023-04-21 ENCOUNTER — Encounter (INDEPENDENT_AMBULATORY_CARE_PROVIDER_SITE_OTHER): Payer: Self-pay

## 2023-04-26 ENCOUNTER — Telehealth: Payer: Self-pay

## 2023-04-26 DIAGNOSIS — Z0289 Encounter for other administrative examinations: Secondary | ICD-10-CM

## 2023-04-26 NOTE — Telephone Encounter (Signed)
Dr. Barron Alvine,    This patient is scheduled to have a colonoscopy with you here at [facility]. BMI > 50 please advise if you would like her to have an OV or direct to the hospital.   Thank you,  Previsit

## 2023-04-28 ENCOUNTER — Ambulatory Visit: Payer: BC Managed Care – PPO | Admitting: Gastroenterology

## 2023-04-28 ENCOUNTER — Telehealth: Payer: Self-pay | Admitting: Gastroenterology

## 2023-04-28 NOTE — Telephone Encounter (Signed)
Same day cancellation for OV today 9am due to weather

## 2023-05-02 ENCOUNTER — Ambulatory Visit (INDEPENDENT_AMBULATORY_CARE_PROVIDER_SITE_OTHER): Payer: BC Managed Care – PPO | Admitting: Adult Health

## 2023-05-02 ENCOUNTER — Encounter (INDEPENDENT_AMBULATORY_CARE_PROVIDER_SITE_OTHER): Payer: Self-pay | Admitting: Adult Health

## 2023-05-02 DIAGNOSIS — E781 Pure hyperglyceridemia: Secondary | ICD-10-CM | POA: Diagnosis not present

## 2023-05-02 DIAGNOSIS — Z6841 Body Mass Index (BMI) 40.0 and over, adult: Secondary | ICD-10-CM | POA: Diagnosis not present

## 2023-05-02 DIAGNOSIS — E669 Obesity, unspecified: Secondary | ICD-10-CM

## 2023-05-02 NOTE — Progress Notes (Signed)
Office: [phone]  /  Fax: [phone]   Initial Visit  Cassandra Barrera was seen in clinic today to evaluate for obesity. She is interested in losing weight to improve overall health and reduce the risk of weight related complications. She presents today to review program treatment options, initial physical assessment, and evaluation.     She was referred by: PCP  When asked what else they would like to accomplish? She states: Adopt healthier eating patterns, Improve energy levels and physical activity, Improve quality of life, Improve appearance, Improve self-confidence, and Other: She states "I want to be healthier"  Weight history: She reports being overweight her entire life.  When asked how has your weight affected you? She states: Has affected self-esteem, Contributed to medical problems, Having fatigue, Having poor endurance, and Problems with eating patterns  Some associated conditions: Vitamin D Deficiency  Contributing factors: Family history, Nutritional, Medications, Stress, Reduced physical activity, and Pregnancy  Weight promoting medications identified: Psychotropic medications  Current nutrition plan: None  Current level of physical activity: None  Current or previous pharmacotherapy: None  Response to medication: Never tried medications   Past medical history includes:   Past Medical History:  Diagnosis Date   Anxiety    Depression    OSA on CPAP    Thyroid disease    underactive     Objective:   BP 133/79   Pulse 79   Temp 98.5 F (36.9 C)   Ht 5' (1.524 m)   Wt 296 lb (134.3 kg)   SpO2 97%   BMI 57.81 kg/m  She was weighed on the bioimpedance scale: Body mass index is 57.81 kg/m.  Peak Weight:307 , Body Fat%:57.8, Visceral Fat Rating:24, Weight trend over the last 12 months: Decreasing  General:  Alert, oriented and cooperative. Patient is in no acute distress.  Respiratory: Normal respiratory effort, no problems with respiration  noted   Gait: able to ambulate independently  Mental Status: Normal mood and affect. Normal behavior. Normal judgment and thought content.   DIAGNOSTIC DATA REVIEWED:  BMET    Component Value Date/Time   NA 139 04/06/2023 1129   NA 140 07/01/2020 1206   K 4.3 04/06/2023 1129   CL 100 04/06/2023 1129   CO2 29 04/06/2023 1129   GLUCOSE 84 04/06/2023 1129   BUN 18 04/06/2023 1129   BUN 12 07/01/2020 1206   CREATININE 0.61 04/06/2023 1129   CREATININE 0.53 03/06/2013 1301   CALCIUM 10.1 04/06/2023 1129   GFRNONAA 108 07/01/2020 1206   GFRAA 125 07/01/2020 1206   Lab Results  Component Value Date   HGBA1C 6.0 04/06/2023   HGBA1C 5.5 09/11/2019   No results found for: "INSULIN" CBC    Component Value Date/Time   WBC 11.9 (H) 03/13/2019 1602   WBC 10.6 (H) 03/06/2013 1301   RBC 4.76 03/13/2019 1602   RBC 4.74 03/06/2013 1301   HGB 13.7 03/13/2019 1602   HCT 42.3 03/13/2019 1602   PLT 385 03/13/2019 1602   MCV 89 03/13/2019 1602   MCH 28.8 03/13/2019 1602   MCH 29.5 03/06/2013 1301   MCHC 32.4 03/13/2019 1602   MCHC 33.7 03/06/2013 1301   RDW 13.2 03/13/2019 1602   Iron/TIBC/Ferritin/ %Sat No results found for: "IRON", "TIBC", "FERRITIN", "IRONPCTSAT" Lipid Panel     Component Value Date/Time   CHOL 181 04/06/2023 1129   CHOL 172 07/01/2020 1206   TRIG 305.0 (H) 04/06/2023 1129   HDL 43.70 04/06/2023 1129   HDL 35 (  L) 07/01/2020 1206   CHOLHDL 4 04/06/2023 1129   VLDL 61.0 (H) 04/06/2023 1129   LDLCALC 75 07/01/2020 1206   LDLDIRECT 110.0 04/06/2023 1129   Hepatic Function Panel     Component Value Date/Time   PROT 7.7 04/06/2023 1129   PROT 7.1 07/28/2017 1602   ALBUMIN 4.5 04/06/2023 1129   ALBUMIN 4.2 07/28/2017 1602   AST 18 04/06/2023 1129   ALT 24 04/06/2023 1129   ALKPHOS 61 04/06/2023 1129   BILITOT 0.5 04/06/2023 1129   BILITOT 0.2 07/28/2017 1602      Component Value Date/Time   TSH 29.60 (H) 04/06/2023 1129     Assessment and Plan:    Morbid obesity (HCC)  Hypertriglyceridemia  Establish with HWW   Obesity Treatment / Action Plan:  Patient will work on garnering support from family and friends to begin weight loss journey. Will work on eliminating or reducing the presence of highly palatable, calorie dense foods in the home. Will complete provided nutritional and psychosocial assessment questionnaire before the next appointment. Will be scheduled for indirect calorimetry to determine resting energy expenditure in a fasting state.  This will allow [place] to create a reduced calorie, high-protein meal plan to promote loss of fat mass while preserving muscle mass. Counseled on the health benefits of losing 5%-15% of total body weight. Was counseled on nutritional approaches to weight loss and benefits of reducing processed foods and consuming plant-based foods and high quality protein as part of nutritional weight management. Was counseled on pharmacotherapy and role as an adjunct in weight management.   Obesity Education Performed Today:  She was weighed on the bioimpedance scale and results were discussed and documented in the synopsis.  We discussed obesity as a disease and the importance of a more detailed evaluation of all the factors contributing to the disease.  We discussed the importance of long term lifestyle changes which include nutrition, exercise and behavioral modifications as well as the importance of customizing this to her specific health and social needs.  We discussed the benefits of reaching a healthier weight to alleviate the symptoms of existing conditions and reduce the risks of the biomechanical, metabolic and psychological effects of obesity.  MACLYN COMES appears to be in the action stage of change and states they are ready to start intensive lifestyle modifications and behavioral modifications.  30 minutes was spent today on this visit including the above counseling, pre-visit chart  review, and post-visit documentation.  Reviewed by clinician on day of visit: allergies, medications, problem list, medical history, surgical history, family history, social history, and previous encounter notes pertinent to obesity diagnosis.   Joziyah Roblero d. Yeraldine Forney, NP-C

## 2023-05-16 ENCOUNTER — Other Ambulatory Visit (INDEPENDENT_AMBULATORY_CARE_PROVIDER_SITE_OTHER): Payer: BC Managed Care – PPO

## 2023-05-16 DIAGNOSIS — E039 Hypothyroidism, unspecified: Secondary | ICD-10-CM

## 2023-05-17 LAB — TSH: TSH: 13.92 u[IU]/mL — ABNORMAL HIGH (ref 0.35–5.50)

## 2023-05-18 ENCOUNTER — Telehealth: Payer: Self-pay | Admitting: Family

## 2023-05-18 DIAGNOSIS — E039 Hypothyroidism, unspecified: Secondary | ICD-10-CM

## 2023-05-18 MED ORDER — LEVOTHYROXINE SODIUM 75 MCG PO TABS: 75.0000 ug | ORAL_TABLET | Freq: Every day | ORAL | 0 refills | Status: DC

## 2023-05-18 NOTE — Telephone Encounter (Signed)
TSH is improving, but we still need to adjust her synthroid.  I would like to increase her synthroid dose from to 75 mcg daily. Repeat TSH in 6 weeks.

## 2023-05-18 NOTE — Telephone Encounter (Signed)
Patient notified of results and medication dose increase. She verbalized understanding and was scheduled to return in October for repeat TSH

## 2023-06-06 ENCOUNTER — Encounter: Payer: Self-pay | Admitting: Obstetrics and Gynecology

## 2023-06-06 ENCOUNTER — Ambulatory Visit (INDEPENDENT_AMBULATORY_CARE_PROVIDER_SITE_OTHER): Payer: BC Managed Care – PPO | Admitting: Obstetrics and Gynecology

## 2023-06-06 ENCOUNTER — Other Ambulatory Visit (HOSPITAL_COMMUNITY)
Admission: RE | Admit: 2023-06-06 | Discharge: 2023-06-06 | Disposition: A | Discharge status: Home / Self Care | Payer: BC Managed Care – PPO | Source: Ambulatory Visit | Attending: Obstetrics and Gynecology | Admitting: Obstetrics and Gynecology

## 2023-06-06 VITALS — BP 124/66 | HR 87 | Ht 60.5 in | Wt 298.0 lb

## 2023-06-06 DIAGNOSIS — Z01419 Encounter for gynecological examination (general) (routine) without abnormal findings: Secondary | ICD-10-CM

## 2023-06-06 DIAGNOSIS — Z1339 Encounter for screening examination for other mental health and behavioral disorders: Secondary | ICD-10-CM | POA: Diagnosis not present

## 2023-06-06 DIAGNOSIS — Z30433 Encounter for removal and reinsertion of intrauterine contraceptive device: Secondary | ICD-10-CM

## 2023-06-06 MED ORDER — LEVONORGESTREL 20 MCG/DAY IU IUD
1.0000 | INTRAUTERINE_SYSTEM | Freq: Once | INTRAUTERINE | Status: AC
Start: 2023-06-06 — End: 2023-06-06
  Administered 2023-06-06: 1 via INTRAUTERINE

## 2023-06-06 NOTE — Progress Notes (Signed)
    GYNECOLOGY OFFICE PROCEDURE NOTE  Cassandra Barrera is a 52 y.o. [id number] here for IUD insertion. No GYN concerns. Pap collected today  IUD Insertion Procedure Note Procedure: IUD insertion with Mirena UPT:  not indicated  Patient identified.  Risks, benefits and alternatives of procedure were discussed including irregular bleeding, cramping, infection, malpositioning or misplacement of the IUD outside the uterus which may require further procedure such as laparoscopy. Also discussed >99% contraception efficacy, increased risk of ectopic pregnancy with failure of method.   Emphasized that this did not protect against STIs, condoms recommended during all sexual encounters. Consent signed.   Speculum inserted. Cervix was difficult to visualize but IUD strings were palpable. Was ultimately able to identify lip of cervix. Hurricane spray applied. Tenaculum was used to grasp cervix and bring it fully into view. Pap was then collected. Additional hurricane spray was applied to the cervix. Tenaculum was replaced on the anterior lip of the cervix. IUD strings were grasped and pulled using ring forceps. The IUD was successfully removed in its entirety.   Cervix prepped with 3 swabs of betadine.  Uterus sounded to 10 cm.  and IUD then inserted without difficulty per manufacturer's instructions and strings cut to 3 cm below cervical os and all instruments removed. Pt tolerated well with minimal pain and bleeding.   Discussed concerning signs/symptoms and to call if heavy bleeding, severe abdominal pain, or fever in the following 3 weeks. Manufacturer pamphlet/patient information given. Reviewed timing of efficacy for contraception and to use an alternative form of birth control until that time.  Harvie Bridge, MD Obstetrician & Gynecologist, [facility] for [employer], [facility]

## 2023-06-06 NOTE — Progress Notes (Signed)
IUD placed maybe at least 10 years ago, is a mirena  Possibly change out IUD today   Last pap at least 10 years ago  Mammo done 03/2023

## 2023-06-06 NOTE — Progress Notes (Signed)
ANNUAL EXAM Patient name: Cassandra Barrera MRN [id number]  Date of birth: 13-Apr-1971 Chief Complaint:   Gynecologic Exam  History of Present Illness:   Cassandra Barrera is a 52 y.o. [id number] with No LMP recorded. (Menstrual status: IUD). being seen today for a routine annual exam.  Current complaints: None. Has had IUD for ~10 years and would like it removed and replaced. She still has occasional periods with IUD in place. No hot flashes/vaginal dryness. Would like effective contraception  Last pap unsure Last mammogram: 04/12/23 B[facility]ADS 1 Last colonoscopy: never, has GI appt scheduled 07/28/23     06/06/2023   10:31 AM 04/06/2023   10:40 AM 07/01/2020   11:09 AM 12/11/2019    8:06 AM 09/11/2019    8:41 AM  Depression screen PHQ 2/9  Decreased Interest 0 0 0 0 0  Down, Depressed, Hopeless 0 0 0 0 0  PHQ - 2 Score 0 0 0 0 0  Altered sleeping 0 0     Tired, decreased energy 0 0     Change in appetite 0 0     Feeling bad or failure about yourself  0 0     Trouble concentrating 0 0     Moving slowly or fidgety/restless 0 0     Suicidal thoughts 0 0     PHQ-9 Score 0 0     Difficult doing work/chores  Not difficult at all           06/06/2023   10:31 AM 12/11/2019    8:07 AM  GAD 7 : Generalized Anxiety Score  Nervous, Anxious, on Edge 0 0  Control/stop worrying 0 0  Worry too much - different things 0 0  Trouble relaxing 0 0  Restless 0 0  Easily annoyed or irritable 0 0  Afraid - awful might happen 0 0  Total GAD 7 Score 0 0  Anxiety Difficulty  Not difficult at all     Review of Systems:   Pertinent items are noted in HPI Denies any headaches, blurred vision, fatigue, shortness of breath, chest pain, abdominal pain, abnormal vaginal discharge/itching/odor/irritation, problems with periods, bowel movements, urination, or intercourse unless otherwise stated above. Pertinent History Reviewed:  Reviewed past medical,surgical, social and family history.  Reviewed problem  list, medications and allergies. Physical Assessment:   Vitals:   06/06/23 1012  BP: 124/66  Pulse: 87  Weight: 298 lb (135.2 kg)  Height: 5' 0.5" (1.537 m)  Body mass index is 57.24 kg/m.        Physical Examination:   General appearance - well appearing, and in no distress  Mental status - alert, oriented to person, place, and time  Chest - respiratory effort normal  Heart - normal peripheral perfusion  Breasts - deferred after discussion with patient  Abdomen - soft, nontender, nondistended, no masses or organomegaly  Pelvic - VULVA: normal appearing vulva with no masses, tenderness or lesions  VAGINA: normal appearing vagina with normal color and discharge, no lesions  CERVIX: normal appearing cervix without discharge or lesions, no CMT  Thin prep pap is done with HR HPV cotesting  UTERUS: uterus is felt to be normal size, shape, consistency and nontender   ADNEXA: No adnexal masses or tenderness noted.  Chaperone present for exam  No results found for this or any previous visit (from the past 24 hour(s)).  Assessment & Plan:  1) Well-Woman Exam Mammogram: in 1 year, or sooner if problems Colonoscopy: scheduled Pap: Collected  today Contraceptive counseling done today. Pt opted for IUD removal & reinsertion.  2) Encounter for IUD removal & reinsertion See procedure note  Labs/procedures today:   No orders of the defined types were placed in this encounter.  Meds:  Meds ordered this encounter  Medications   levonorgestrel (MIRENA) 20 MCG/DAY IUD 1 each   Follow-up: Return in about 1 year (around 06/05/2024) for annual exam or sooner as needed.  Lennart Pall, MD 06/06/2023 11:22 AM

## 2023-06-06 NOTE — Patient Instructions (Signed)
It was nice meeting you today! You will see your pap test results in the MyChart app within a week

## 2023-06-08 ENCOUNTER — Encounter: Payer: BC Managed Care – PPO | Admitting: Gastroenterology

## 2023-06-10 LAB — CYTOLOGY - PAP
Comment: NEGATIVE
Diagnosis: NEGATIVE
High risk HPV: NEGATIVE

## 2023-06-15 ENCOUNTER — Encounter (INDEPENDENT_AMBULATORY_CARE_PROVIDER_SITE_OTHER): Payer: Self-pay | Admitting: Family Medicine

## 2023-06-15 ENCOUNTER — Ambulatory Visit (INDEPENDENT_AMBULATORY_CARE_PROVIDER_SITE_OTHER): Payer: BC Managed Care – PPO | Admitting: Family Medicine

## 2023-06-15 VITALS — BP 108/73 | HR 77 | Temp 98.8°F | Ht 60.5 in | Wt 296.0 lb

## 2023-06-15 DIAGNOSIS — E039 Hypothyroidism, unspecified: Secondary | ICD-10-CM

## 2023-06-15 DIAGNOSIS — R0602 Shortness of breath: Secondary | ICD-10-CM

## 2023-06-15 DIAGNOSIS — E559 Vitamin D deficiency, unspecified: Secondary | ICD-10-CM | POA: Diagnosis not present

## 2023-06-15 DIAGNOSIS — Z1331 Encounter for screening for depression: Secondary | ICD-10-CM

## 2023-06-15 DIAGNOSIS — R5383 Other fatigue: Secondary | ICD-10-CM | POA: Diagnosis not present

## 2023-06-15 DIAGNOSIS — Z6841 Body Mass Index (BMI) 40.0 and over, adult: Secondary | ICD-10-CM

## 2023-06-15 DIAGNOSIS — G4733 Obstructive sleep apnea (adult) (pediatric): Secondary | ICD-10-CM

## 2023-06-15 DIAGNOSIS — F39 Unspecified mood [affective] disorder: Secondary | ICD-10-CM | POA: Diagnosis not present

## 2023-06-15 NOTE — Progress Notes (Signed)
Cassandra Barrera, D.O.  ABFM, ABOM Specializing in Clinical Bariatric Medicine Office located at: [address]  [place], [place]  [address]     Bariatric Medicine Visit  Dear Cassandra Craze, NP   Thank you for referring Cassandra Barrera to our clinic today for evaluation.  We performed a consultation to discuss her options for treatment and educate the patient on her disease state.  The following note includes my evaluation and treatment recommendations.   Please do not hesitate to reach out to me directly if you have any further concerns.   Assessment and Plan:   Orders Placed This Encounter  Procedures   CBC with Differential/Platelet   Folate   Insulin, random   Vitamin B12   EKG 12-Lead    There are no discontinued medications.   No orders of the defined types were placed in this encounter.    Fatigue Assessment & Plan: Cassandra Barrera does feel that her weight is causing her energy to be lower than it should be. Fatigue may be related to obesity, depression or many other causes. she does not appear to have any red flag symptoms and this appears to most likely be related to her current lifestyle habits and dietary intake.  Labs will be ordered and reviewed with her at their next office visit in two weeks.  Epworth sleepiness scale score appears to be within normal limits.  Her ESS score is 4.   Cassandra Barrera admits to daytime somnolence and denies waking up still tired. Patient has a history of symptoms of daytime fatigue. Cassandra Barrera generally gets 7 hours of sleep per night, and states that she has generally restful sleep. Snoring is not present. Apneic episodes are not present.   ECG: Performed and reviewed/ interpreted independently.  Normal sinus rhythm, rate 73 bpm; reassuring without any acute abnormalities, will continue to monitor for symptoms    Shortness of breath on exertion Assessment & Plan: Cassandra Barrera does feel that she gets out of breath more easily  than she used to when she exercises and seems to be worsening over time with weight gain.  This has gotten worse recently. Cassandra Barrera denies shortness of breath at rest or orthopnea. Cassandra Barrera's shortness of breath appears to be obesity related and exercise induced, as they do not appear to have any "red flag" symptoms/ concerns today.  Also, this condition appears to be related to a state of poor cardiovascular conditioning   Obtain labs today and will be reviewed with her at their next office visit in two weeks.  Indirect Calorimeter completed today to help guide our dietary regimen. It shows a VO2 of 314 and a REE of 2174.  Her calculated basal metabolic rate is 6213 thus her measured basal metabolic rate is better than expected.  Patient agreed to work on weight loss at this time.  As Cassandra Barrera progresses through our weight loss program, we will gradually increase exercise as tolerated to treat her current condition.   If Cassandra Barrera follows our recommendations and loses 5-10% of their weight without improvement of her shortness of breath or if at any time, symptoms become more concerning, they agree to urgently follow up with their PCP/ specialist for further consideration/ evaluation.   Cassandra Barrera verbalizes agreement with this plan.    Mood disorder (HCC) - emotional eating Assessment & Plan: Cassandra Barrera has been on zoloft since her oldest daughter was born, currently is on 200 mg daily. Per pt, her moods are well controlled. Denies any SI/HI. Pt has  the tendency to eat when bored to comfort herself. She endorses occasionally eating too quickly to the point of feeling "uncomfortably full". She reports a family history of mood disorders. Patient was referred to Dr. Dewaine Conger, our Bariatric Psychologist. Begin prudent nutritional plan and we will start monitoring condition. Discussed healthy alternatives, even when eating out. Educated Pt on apps for tracking daily caloric intake.   Modified PHQ-9  Depression Screen: Her Food and Mood (modified PHQ-9) score was 12.   Vitamin D deficiency Assessment & Plan: Lab Results  Component Value Date   VD25OH 24.30 (L) 04/06/2023   VD25OH 23.9 (L) 03/13/2019   Pt is taking vitamin D mcg daily. We reviewed her latest labs from 04/06/23 (vitamin D, CMP, A1C, lipid panel, T3 and T4). Weight loss will likely improve availability of vitamin D, thus encouraged Cassandra Barrera to continue with meal plan and their weight loss efforts to further improve this condition. Ideal vitamin D levels reviewed with patient     Acquired hypothyroidism Assessment & Plan: Lab Results  Component Value Date   TSH 13.92 (H) 05/16/2023   Pt reports having hypothyroidism her "whole life". She is taking Synthroid 75 mcg daily, per her PCP Cassandra Craze, NP. Her condition is well controlled on this regimen.    Obstructive sleep apnea on CPAP Assessment & Plan: She has been on CPAP for about 6 years. Tolerating CPAP well. Symptoms have improved since using CPAP.   BMI 50.0-59.9, adult (HCC) - current BMI 56.83 Morbid obesity with BMI of 50.0-59.9, adult (HCC) - starting BMI 56.83 06/15/23 Assessment & Plan: Muscle mass is  117.6lb. Fat mass is 172.4 lb. Paislie will work on healthier eating habits and try their best to follow the Category 2 plan with 1600-1700 calories and 120+ g of protein a day and journaling  Behavioral Intervention Additional resources provided today: category 2 meal plan information and Food journaling plan information Evidence-based interventions for health behavior change were utilized today including the discussion of self monitoring techniques, problem-solving barriers and SMART goal setting techniques.   Regarding patient's less desirable eating habits and patterns, we employed the technique of small changes.  Pt will specifically work on: beginning prescribed meal plan and start journaling for next visit.    FOLLOW UP: Follow up in 2  weeks. She was informed of the importance of frequent follow up visits to maximize her success with intensive lifestyle modifications for her multiple health conditions.  Cassandra Barrera is aware that we will review all of her lab results at our next visit.  She is aware that if anything is critical/ life threatening with the results, we will be contacting her via MyChart prior to the office visit to discuss management.    Chief Complaint:   OBESITY Cassandra Barrera (MR# [id number]) is a 52 y.o. female who presents for evaluation and treatment of obesity and related comorbidities. Current BMI is Body mass index is 56.86 kg/m. Cassandra Barrera has been struggling with her weight for many years and has been unsuccessful in either losing weight, maintaining weight loss, or reaching her healthy weight goal.  Cassandra Barrera is currently in the action stage of change and ready to dedicate time achieving and maintaining a healthier weight. Cassandra Barrera is interested in becoming our patient and working on intensive lifestyle modifications including (but not limited to) diet and exercise for weight loss.  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  Patient is married to husband, Cassandra Barrera, and has 2 children. She lives with her husband.   Interested in losing weight since her youngest child (son) is in college now, so she has time to focus on her own health.   No regular exercise.  Reasons she attributes to weight gain: Sedentary lifestyle after childbirth.  Has tried low fat diets in past.  Has tried moderate exercise in the past.  Lost about 15 lbs in college with moderate exercise as well.   Cassandra Barrera the weight off for 8 years, until her first pregnancy.  Eats fast food or take out 7 days a week.   Cravings: salty and savory.  Snacks on popcorn and corn chips.  Occasionally has tea with sugar.  Worse habit is fast food and take  out.   Subjective:   This is the patient's first visit at [facility].  The patient's NEW PATIENT PACKET that they filled out prior to today's office visit was reviewed at length and information from that paperwork was included within the following office visit note.    Included in the packet: current and past health history, medications, allergies, ROS, gynecologic history (women only), surgical history, family history, social history, weight history, weight loss surgery history (for those that have had weight loss surgery), nutritional evaluation, mood and food questionnaire along with a depression screening (PHQ9) on all patients, an Epworth questionnaire, sleep habits questionnaire, patient life and health improvement goals questionnaire. These will all be scanned into the patient's chart under the "media" tab.   Review of Systems: Please refer to new patient packet scanned into media. Pertinent positives were addressed with patient today.  Reviewed by clinician on day of visit: allergies, medications, problem list, medical history, surgical history, family history, social history, and previous encounter notes.  During the visit, I independently reviewed the patient's EKG, bioimpedance scale results, and indirect calorimeter results. I used this information to tailor a meal plan for the patient that will help Cassandra Barrera to lose weight and will improve her obesity-related conditions going forward.  I performed a medically necessary appropriate examination and/or evaluation. I discussed the assessment and treatment plan with the patient. The patient was provided an opportunity to ask questions and all were answered. The patient agreed with the plan and demonstrated an understanding of the instructions. Labs were ordered today (unless patient declined them) and will be reviewed with the patient at our next visit unless more critical results need to be addressed immediately.  Clinical information was updated and documented in the EMR.   Objective:   PHYSICAL EXAM: Blood pressure 108/73, pulse 77, temperature 98.8 F (37.1 C), height 5' 0.5" (1.537 m), weight 296 lb (134.3 kg), SpO2 96%. Body mass index is 56.86 kg/m. General: Well Developed, well nourished, and in no acute distress.  HEENT: Normocephalic, atraumatic Skin: Warm and dry, cap RF less 2 sec, good turgor Chest:  Normal excursion, shape, no gross abn Respiratory: speaking in full sentences, no conversational dyspnea NeuroM-Sk: Ambulates w/o assistance, moves * 4 Psych: A and O *3, insight good, mood-full  Anthropometric Measurements Height: 5' 0.5" (1.537 m) Weight: 296 lb (134.3 kg) BMI (Calculated): 56.83 Weight at Last Visit: na Weight Lost Since Last Visit: na Weight Gained Since Last Visit: na Starting Weight: 296lb Total Weight Loss (lbs): 0 lb (0 kg) Peak Weight: 307lb Waist Measurement : 55 inches   Body Composition  Body Fat %: 58.2 % Fat Mass (lbs): 172.4 lbs Muscle Mass (  lbs): 117.6 lbs Visceral Fat Rating : 24   Other Clinical Data RMR: [id number] Fasting: yes Labs: yes Today's Visit #: 1 Starting Date: 06/15/23 Comments: first visit    DIAGNOSTIC DATA REVIEWED:  BMET    Component Value Date/Time   NA 139 04/06/2023 1129   NA 140 07/01/2020 1206   K 4.3 04/06/2023 1129   CL 100 04/06/2023 1129   CO2 29 04/06/2023 1129   GLUCOSE 84 04/06/2023 1129   BUN 18 04/06/2023 1129   BUN 12 07/01/2020 1206   CREATININE 0.61 04/06/2023 1129   CREATININE 0.53 03/06/2013 1301   CALCIUM 10.1 04/06/2023 1129   GFRNONAA 108 07/01/2020 1206   GFRAA 125 07/01/2020 1206   Lab Results  Component Value Date   HGBA1C 6.0 04/06/2023   HGBA1C 5.5 09/11/2019   No results found for: "INSULIN" Lab Results  Component Value Date   TSH 13.92 (H) 05/16/2023   CBC    Component Value Date/Time   WBC 11.9 (H) 03/13/2019 1602   WBC 10.6 (H) 03/06/2013 1301   RBC 4.76 03/13/2019  1602   RBC 4.74 03/06/2013 1301   HGB 13.7 03/13/2019 1602   HCT 42.3 03/13/2019 1602   PLT 385 03/13/2019 1602   MCV 89 03/13/2019 1602   MCH 28.8 03/13/2019 1602   MCH 29.5 03/06/2013 1301   MCHC 32.4 03/13/2019 1602   MCHC 33.7 03/06/2013 1301   RDW 13.2 03/13/2019 1602   Iron Studies No results found for: "IRON", "TIBC", "FERRITIN", "IRONPCTSAT" Lipid Panel     Component Value Date/Time   CHOL 181 04/06/2023 1129   CHOL 172 07/01/2020 1206   TRIG 305.0 (H) 04/06/2023 1129   HDL 43.70 04/06/2023 1129   HDL 35 (L) 07/01/2020 1206   CHOLHDL 4 04/06/2023 1129   VLDL 61.0 (H) 04/06/2023 1129   LDLCALC 75 07/01/2020 1206   LDLDIRECT 110.0 04/06/2023 1129   Hepatic Function Panel     Component Value Date/Time   PROT 7.7 04/06/2023 1129   PROT 7.1 07/28/2017 1602   ALBUMIN 4.5 04/06/2023 1129   ALBUMIN 4.2 07/28/2017 1602   AST 18 04/06/2023 1129   ALT 24 04/06/2023 1129   ALKPHOS 61 04/06/2023 1129   BILITOT 0.5 04/06/2023 1129   BILITOT 0.2 07/28/2017 1602      Component Value Date/Time   TSH 13.92 (H) 05/16/2023 1456   Nutritional Lab Results  Component Value Date   VD25OH 24.30 (L) 04/06/2023   VD25OH 23.9 (L) 03/13/2019    Attestation Statements:   Burnett Sheng, acting as a medical scribe for Thomasene Lot, DO., have compiled all relevant documentation for today's office visit on behalf of Thomasene Lot, DO, while in the presence of [employer], DO.  Time spent on visit including pre-visit chart review and post-visit care was estimated to be 60 minutes. Over 50% of the time was spent in direct face to face counseling and coordination of care.  I have reviewed the above documentation for accuracy and completeness, and I agree with the above. Cassandra Barrera, D.O.  The 21st Century Cures Act was signed into law in 2016 which includes the topic of electronic health records.  This provides immediate access to information in MyChart.  This includes  consultation notes, operative notes, office notes, lab results and pathology reports.  If you have any questions about what you read please let us know at your next visit so we can discuss your concerns and take corrective action if need  be.  We are right here with you.

## 2023-06-16 LAB — CBC WITH DIFFERENTIAL/PLATELET
Basophils Absolute: 0 10*3/uL (ref 0.0–0.2)
Basos: 0 %
EOS (ABSOLUTE): 0.3 10*3/uL (ref 0.0–0.4)
Eos: 4 %
Hematocrit: 42 % (ref 34.0–46.6)
Hemoglobin: 13.7 g/dL (ref 11.1–15.9)
Immature Grans (Abs): 0.1 10*3/uL (ref 0.0–0.1)
Immature Granulocytes: 1 %
Lymphocytes Absolute: 2 10*3/uL (ref 0.7–3.1)
Lymphs: 23 %
MCH: 29.6 pg (ref 26.6–33.0)
MCHC: 32.6 g/dL (ref 31.5–35.7)
MCV: 91 fL (ref 79–97)
Monocytes Absolute: 0.5 10*3/uL (ref 0.1–0.9)
Monocytes: 5 %
Neutrophils Absolute: 6 10*3/uL (ref 1.4–7.0)
Neutrophils: 67 %
Platelets: 296 10*3/uL (ref 150–450)
RBC: 4.63 x10E6/uL (ref 3.77–5.28)
RDW: 13.3 % (ref 11.7–15.4)
WBC: 8.9 10*3/uL (ref 3.4–10.8)

## 2023-06-16 LAB — FOLATE: Folate: 6.2 ng/mL (ref >3.0)

## 2023-06-16 LAB — INSULIN, RANDOM

## 2023-06-16 LAB — VITAMIN B12: Vitamin B-12: >2000 pg/mL — ABNORMAL HIGH (ref 232–1245)

## 2023-06-29 ENCOUNTER — Ambulatory Visit (INDEPENDENT_AMBULATORY_CARE_PROVIDER_SITE_OTHER): Payer: BC Managed Care – PPO | Admitting: Family Medicine

## 2023-07-04 ENCOUNTER — Ambulatory Visit (INDEPENDENT_AMBULATORY_CARE_PROVIDER_SITE_OTHER): Payer: BC Managed Care – PPO | Admitting: Family Medicine

## 2023-07-04 ENCOUNTER — Encounter (INDEPENDENT_AMBULATORY_CARE_PROVIDER_SITE_OTHER): Payer: Self-pay | Admitting: Family Medicine

## 2023-07-04 VITALS — BP 114/74 | HR 65 | Temp 98.7°F | Ht 60.5 in | Wt 290.0 lb

## 2023-07-04 DIAGNOSIS — E039 Hypothyroidism, unspecified: Secondary | ICD-10-CM

## 2023-07-04 DIAGNOSIS — E559 Vitamin D deficiency, unspecified: Secondary | ICD-10-CM | POA: Diagnosis not present

## 2023-07-04 DIAGNOSIS — E782 Mixed hyperlipidemia: Secondary | ICD-10-CM

## 2023-07-04 DIAGNOSIS — R7303 Prediabetes: Secondary | ICD-10-CM | POA: Diagnosis not present

## 2023-07-04 DIAGNOSIS — Z6841 Body Mass Index (BMI) 40.0 and over, adult: Secondary | ICD-10-CM

## 2023-07-04 NOTE — Patient Instructions (Signed)
The 10-year ASCVD risk score (Arnett DK, et al., 2019) is: 1.2%   Values used to calculate the score:     Age: 52 years     Sex: Female     Is Non-Hispanic African American: No     Diabetic: No     Tobacco smoker: No     Systolic Blood Pressure: 114 mmHg     Is BP treated: No     HDL Cholesterol: 43.7 mg/dL     Total Cholesterol: 181 mg/dL

## 2023-07-04 NOTE — Progress Notes (Signed)
[name], D.O.  ABFM, ABOM Clinical Bariatric Medicine Physician  Office located at: [address] [place], [place]  [address]     Assessment and Plan:  Vitamin D deficiency Assessment: Condition is Not at goal.. Pt vitamin D level is below optimal level. Her CMP, A1c, lipid panel were reviewed with her last OV. Pt continues OTC vitamin D supplement 2,000 BID and denies any adverse side effects. Pt folate level is within optimal range. However, her B-12 level is elevated at >2000. Pt informed me she has discontinued her vitamin B-12 supplement.  Lab Results  Component Value Date   VD25OH 24.30 (L) 04/06/2023   VD25OH 23.9 (L) 03/13/2019   Lab Results  Component Value Date   VITAMINB12 >2000 (H) 06/15/2023      Component Ref Range & Units 06/15/2023  Folate >3.0 ng/mL 6.2     Plan: - Continue with OTC Cholecalciferol at current dose as directed consistently.   - weight loss will likely improve availability of vitamin D, thus encouraged [place] to continue with meal plan and their weight loss efforts to further improve this condition.  Thus, we will need to monitor levels regularly (every 3-4 mo on average) to keep levels within normal limits and prevent over supplementation.  Labs were reviewed with patient today and education provided on them. We discussed how the foods patient eats may influence these laboratory findings.  All of the patient's questions about them were answered    Acquired hypothyroidism Assessment: Condition is Controlled.. Pt continue to take Synthroid regularly without difficulty. She will have her thyroid levels recheck tomorrow. Her TSH was 29.60, Free T3 was 4.3, and Free T4 was 0.30 on 04/06/2023. Her synthroid was increase from to last month.  Lab Results  Component Value Date   TSH 13.92 (H) 05/16/2023   T4TOTAL 7.1 04/20/2018   Plan: - Continue Synthroid at current dose as directed.   - PCP will repeat labs  tomorrow. I advised that she check all thyroid levels.   - Also educated pt on ways to improve Synthroid uptake.   Labs were reviewed with patient today and education provided on them. We discussed how the foods patient eats may influence these laboratory findings.  All of the patient's questions about them were answered    Prediabetes- new onset Assessment: Condition is Not optimized. This is diet/exercise controlled. Pt A1c is elevated at 6.0 and her insulin is 14.1. Pt CBC is within normal range. She endorses having hunger and cravings at the beginning of her plan. These feelings have diminished since then.  Lab Results  Component Value Date   HGBA1C 6.0 04/06/2023   HGBA1C 5.5 09/11/2019   INSULIN 14.1 06/15/2023   Lab Results  Component Value Date   WBC 8.9 06/15/2023   HGB 13.7 06/15/2023   HCT 42.0 06/15/2023   MCV 91 06/15/2023   PLT 296 06/15/2023   Plan: - This is a new diagnosis for NAZYIA GAUGH   - Continue to decrease simple carbs/ sugars; increase fiber and proteins -> follow her meal plan.  Explained role of simple carbs and insulin levels on hunger and cravings  - Handouts provided at pt's request after education provided.  All concerns/questions addressed.    - Anticipatory guidance given.    Tali Cleaves will continue to work on weight loss, exercise, via their meal plan we devised to help decrease the risk of progressing to diabetes.   - We will recheck  A1c and fasting insulin level in approximately 3 months from last check, or as deemed appropriate.   Labs were reviewed with patient today and education provided on them. We discussed how the foods patient eats may influence these laboratory findings.  All of the patient's questions about them were answered    Mixed hyperlipidemia Assessment: Condition is Not at goal.. Pt triglycerides are extremely elevated at 305.0. This is diet/exercise controlled.  Lab Results  Component Value Date   CHOL 181  04/06/2023   HDL 43.70 04/06/2023   LDLCALC 75 07/01/2020   LDLDIRECT 110.0 04/06/2023   TRIG 305.0 (H) 04/06/2023   CHOLHDL 4 04/06/2023  The 10-year ASCVD risk score (Arnett DK, et al., 2019) is: 1.2%   Values used to calculate the score:     Age: 52 years     Sex: Female     Is Non-Hispanic African American: No     Diabetic: No     Tobacco smoker: No     Systolic Blood Pressure: 114 mmHg     Is BP treated: No     HDL Cholesterol: 43.7 mg/dL     Total Cholesterol: 181 mg/dL  Plan: - I informed pt that she does not need to start any hyperlipidemia medication.   Daymon Larsen agrees to continue with meds and/or our treatment plan of a heart-heathy, low cholesterol meal plan.  - Cardiovascular risk and specific lipid/LDL goals reviewed.  - I stressed the importance that patient continue with our prudent nutritional plan that is low in saturated and trans fats, and low in fatty carbs to improve these numbers.   Labs were reviewed with patient today and education provided on them. We discussed how the foods patient eats may influence these laboratory findings.  All of the patient's questions about them were answered    TREATMENT PLAN FOR OBESITY: BMI 50.0-59.9, adult (HCC) - current BMI 55.68 Morbid obesity with BMI of 50.0-59.9, adult (HCC) - starting BMI 56.83 06/15/23 Assessment:  SHALAYA SWAILES is here to discuss her progress with her obesity treatment plan along with follow-up of her obesity related diagnoses. See Medical Weight Management Flowsheet for complete bioelectrical impedance results.  Condition is docourse: improving.   Since last office visit patient's  Muscle mass has increased by 2.6lb. Fat mass has decreased by 8.8lb. Total body water has decreased by 1lb.  Counseling done on how various foods will affect these numbers and how to maximize success  Total lbs lost to date: 6 Total weight loss percentage to date: 2.03%   Plan: - Continue to use the  Category 2 Plan as a guide and keeping a food journal and adhering to recommended goals of 1600-1700 calories and 120g+ protein best they can.   Behavioral Intervention Additional resources provided today:  insulin resistance handout Evidence-based interventions for health behavior change were utilized today including the discussion of self monitoring techniques, problem-solving barriers and SMART goal setting techniques.   Regarding patient's less desirable eating habits and patterns, we employed the technique of small changes.  Pt will specifically work on: Continue to journal for next visit.   FOLLOW UP: Return in about 2 weeks (around 07/18/2023). She was informed of the importance of frequent follow up visits to maximize her success with intensive lifestyle modifications for her multiple health conditions.  Subjective:   Chief complaint: Obesity Addisen is here to discuss her progress with her obesity treatment plan. She is on the Category 2 Plan as a guide and  keeping a food journal and adhering to recommended goals of 1600-1700 calories and 120g+ protein and states she is following her eating plan approximately 90 % of the time. She states she is walking 15-20 minutes 7 days per week.  Interval History:  KHALIE WINCE is here today for her first follow-up office visit since starting the program with [place].  Since last office visit she has been well. She notes that trying to eat all of her protein goals and not go over calories were hard. Pt has journaled every day since her last visit. She notes having more energy when eating more protein. Pt has a upcoming appt with Dr. Dewaine Conger next week.   All blood work/ lab tests that were recently ordered by myself or an outside provider were reviewed with patient today per their request. Extended time was spent counseling her on all new disease processes that were discovered or preexisting ones that are affected by BMI.  she understands that many of  these abnormalities will need to monitored regularly along with the current treatment plan of prudent dietary changes, in which we are making each and every office visit, to improve these health parameters.  Review of Systems:  Pertinent positives were addressed with patient today.  Reviewed by clinician on day of visit: allergies, medications, problem list, medical history, surgical history, family history, social history, and previous encounter notes.  Weight Summary and Biometrics   Weight Lost Since Last Visit: 6lb  Weight Gained Since Last Visit: 0lb   Vitals Temp: 98.7 F (37.1 C) BP: 114/74 Pulse Rate: 65 SpO2: 97 %   Anthropometric Measurements Height: 5' 0.5" (1.537 m) Weight: 290 lb (131.5 kg) BMI (Calculated): 55.68 Weight at Last Visit: 296lb Weight Lost Since Last Visit: 6lb Weight Gained Since Last Visit: 0lb Starting Weight: 296lb Total Weight Loss (lbs): 6 lb (2.722 kg) Peak Weight: 307lb   Body Composition  Body Fat %: 56.4 % Fat Mass (lbs): 163.6 lbs Muscle Mass (lbs): 120.2 lbs Visceral Fat Rating : 23   Other Clinical Data Fasting: no Labs: no Today's Visit #: 2 Starting Date: 06/15/23     Objective:   PHYSICAL EXAM:  Blood pressure 114/74, pulse 65, temperature 98.7 F (37.1 C), height 5' 0.5" (1.537 m), weight 290 lb (131.5 kg), SpO2 97%. Body mass index is 55.7 kg/m.  General: Well Developed, well nourished, and in no acute distress.  HEENT: Normocephalic, atraumatic Skin: Warm and dry, cap RF less 2 sec, good turgor Chest:  Normal excursion, shape, no gross abn Respiratory: speaking in full sentences, no conversational dyspnea NeuroM-Sk: Ambulates w/o assistance, moves * 4 Psych: A and O *3, insight good, mood-full  DIAGNOSTIC DATA REVIEWED:  BMET    Component Value Date/Time   NA 139 04/06/2023 1129   NA 140 07/01/2020 1206   K 4.3 04/06/2023 1129   CL 100 04/06/2023 1129   CO2 29 04/06/2023 1129   GLUCOSE 84  04/06/2023 1129   BUN 18 04/06/2023 1129   BUN 12 07/01/2020 1206   CREATININE 0.61 04/06/2023 1129   CREATININE 0.53 03/06/2013 1301   CALCIUM 10.1 04/06/2023 1129   GFRNONAA 108 07/01/2020 1206   GFRAA 125 07/01/2020 1206   Lab Results  Component Value Date   HGBA1C 6.0 04/06/2023   HGBA1C 5.5 09/11/2019   Lab Results  Component Value Date   INSULIN 14.1 06/15/2023   Lab Results  Component Value Date   TSH 13.92 (H) 05/16/2023   CBC  Component Value Date/Time   WBC 8.9 06/15/2023 0939   WBC 10.6 (H) 03/06/2013 1301   RBC 4.63 06/15/2023 0939   RBC 4.74 03/06/2013 1301   HGB 13.7 06/15/2023 0939   HCT 42.0 06/15/2023 0939   PLT 296 06/15/2023 0939   MCV 91 06/15/2023 0939   MCH 29.6 06/15/2023 0939   MCH 29.5 03/06/2013 1301   MCHC 32.6 06/15/2023 0939   MCHC 33.7 03/06/2013 1301   RDW 13.3 06/15/2023 0939   Iron Studies No results found for: "IRON", "TIBC", "FERRITIN", "IRONPCTSAT" Lipid Panel     Component Value Date/Time   CHOL 181 04/06/2023 1129   CHOL 172 07/01/2020 1206   TRIG 305.0 (H) 04/06/2023 1129   HDL 43.70 04/06/2023 1129   HDL 35 (L) 07/01/2020 1206   CHOLHDL 4 04/06/2023 1129   VLDL 61.0 (H) 04/06/2023 1129   LDLCALC 75 07/01/2020 1206   LDLDIRECT 110.0 04/06/2023 1129   Hepatic Function Panel     Component Value Date/Time   PROT 7.7 04/06/2023 1129   PROT 7.1 07/28/2017 1602   ALBUMIN 4.5 04/06/2023 1129   ALBUMIN 4.2 07/28/2017 1602   AST 18 04/06/2023 1129   ALT 24 04/06/2023 1129   ALKPHOS 61 04/06/2023 1129   BILITOT 0.5 04/06/2023 1129   BILITOT 0.2 07/28/2017 1602      Component Value Date/Time   TSH 13.92 (H) 05/16/2023 1456   Nutritional Lab Results  Component Value Date   VD25OH 24.30 (L) 04/06/2023   VD25OH 23.9 (L) 03/13/2019    Attestations:   Reviewed by clinician on day of visit: allergies, medications, problem list, medical history, surgical history, family history, social history, and previous  encounter notes.   Patient was in the office today and time spent on visit including pre-visit chart review and post-visit care/coordination of care and electronic medical record documentation was 49 minutes. 50% of the time was in face to face counseling of this patient's medical condition(s) and providing education on treatment options to include the first-line treatment of diet and lifestyle modification.  I, Clinical biochemist, acting as a Stage manager for [employer], DO., have compiled all relevant documentation for today's office visit on behalf of Thomasene Lot, DO, while in the presence of [employer], DO.  I have reviewed the above documentation for accuracy and completeness, and I agree with the above. [name], D.O.  The 21st Century Cures Act was signed into law in 2016 which includes the topic of electronic health records.  This provides immediate access to information in MyChart.  This includes consultation notes, operative notes, office notes, lab results and pathology reports.  If you have any questions about what you read please let us know at your next visit so we can discuss your concerns and take corrective action if need be.  We are right here with you.

## 2023-07-05 ENCOUNTER — Other Ambulatory Visit (INDEPENDENT_AMBULATORY_CARE_PROVIDER_SITE_OTHER): Payer: BC Managed Care – PPO

## 2023-07-05 DIAGNOSIS — E039 Hypothyroidism, unspecified: Secondary | ICD-10-CM

## 2023-07-06 ENCOUNTER — Telehealth: Payer: Self-pay | Admitting: Family

## 2023-07-06 DIAGNOSIS — E039 Hypothyroidism, unspecified: Secondary | ICD-10-CM

## 2023-07-06 LAB — TSH: TSH: 12.99 u[IU]/mL — ABNORMAL HIGH (ref 0.35–5.50)

## 2023-07-06 MED ORDER — LEVOTHYROXINE SODIUM 100 MCG PO TABS
100.0000 ug | ORAL_TABLET | Freq: Every day | ORAL | 1 refills | Status: DC
Start: 2023-07-06 — End: 2023-09-23

## 2023-07-06 NOTE — Telephone Encounter (Signed)
Lab work shows thyroid med still needs increase. Please increase from 75 mcg to 100 mcg of synthroid once daily. Take first thing in the morning with water and wait 30 minutes before eating or taking other medications. Repeat TSH in 6 weeks.

## 2023-07-08 NOTE — Telephone Encounter (Signed)
Patient notified of results and medication adjustment, she will like to get T3 check as well when she comes back for TSH. Please advise if ok. (This was a suggestion from her weight loss doctor)

## 2023-07-11 ENCOUNTER — Telehealth (INDEPENDENT_AMBULATORY_CARE_PROVIDER_SITE_OTHER): Payer: BC Managed Care – PPO | Admitting: Psychology

## 2023-07-11 DIAGNOSIS — F5089 Other specified eating disorder: Secondary | ICD-10-CM

## 2023-07-11 NOTE — Progress Notes (Signed)
Office: [phone]  /  Fax: [phone]    Date: July 11, 2023    Appointment Start Time: 9:03am Duration: 27 minutes Provider: Lawerance Cruel, Psy.D. Type of Session: Intake for Individual Therapy  Location of Patient: Parked in car outside of home (private location) Location of Provider: Provider's home (private office) Type of Contact: Telepsychological Visit via MyChart Video Visit  Informed Consent: Prior to proceeding with today's appointment, two pieces of identifying information were obtained. In addition, Cassandra Barrera's physical location at the time of this appointment was obtained as well a phone number she could be reached at in the event of technical difficulties. Cassandra Barrera and this provider participated in today's telepsychological service.   The provider's role was explained to Cassandra Barrera. The provider reviewed and discussed issues of confidentiality, privacy, and limits therein (e.g., reporting obligations). In addition to verbal informed consent, written informed consent for psychological services was obtained prior to the initial appointment. Since the clinic is not a 24/7 crisis center, mental health emergency resources were shared and this  provider explained MyChart, e-mail, voicemail, and/Barrera other messaging systems should be utilized only for non-emergency reasons. This provider also explained that information obtained during appointments will be placed in Cassandra Barrera's medical record and relevant information will be shared with other providers at [facility] at any locations for coordination of care. Cassandra Barrera agreed information may be shared with other [facility] providers as needed for coordination of care and by signing the service agreement document, she provided written consent for coordination of care. Prior to initiating telepsychological services, Cassandra Barrera completed an informed consent document, which included the development of a  safety plan (i.e., an emergency contact and emergency resources) in the event of an emergency/crisis. Cassandra Barrera verbally acknowledged understanding she is ultimately responsible for understanding her insurance benefits for telepsychological and in-person services. This provider also reviewed confidentiality, as it relates to telepsychological services. Cassandra Barrera  acknowledged understanding that appointments cannot be recorded without both party consent and she is aware she is responsible for securing confidentiality on her end of the session. Cassandra Barrera verbally consented to proceed.  Chief Complaint/HPI: Cassandra Barrera was referred by Cassandra Barrera due to mood disorder-emotional eating. Per the note for the visit with Cassandra Barrera on 06/15/2023, "Cassandra Barrera has been on zoloft since her oldest daughter was born, currently is on 200 mg daily. Per pt, her moods are well controlled. Denies any SI/HI. Pt has the tendency to eat when bored to comfort herself. She endorses occasionally eating too quickly to the point of feeling "uncomfortably full". She reports a family history of mood disorders. Patient was referred to Cassandra Barrera, our Bariatric Psychologist. Begin prudent nutritional plan and we will start monitoring condition. Discussed healthy alternatives, even when eating out. Educated Pt on apps for tracking daily caloric intake."   During today's appointment, Cassandra Barrera was verbally administered a questionnaire assessing various behaviors related to emotional eating behaviors. Cassandra Barrera endorsed the following: overeat when you are celebrating, experience food cravings on a regular basis, eat certain foods when you are anxious, stressed, depressed, Barrera your feelings are hurt, use food to help you cope with emotional situations, find food is comforting to you, overeat when you are worried about something, overeat frequently when you are bored Barrera lonely, not worry about what you eat when you are in a good mood,  overeat when you are alone, but eat much less when you are with other people, eat to help you stay awake, and eat  as a reward. She shared she craves "salty snacks." Cassandra Barrera believes the onset of emotional eating behaviors was likely during childhood, and described the current frequency of emotional eating behaviors as "few times a month." In addition, Cassandra Barrera denied a history of binge eating behaviors. Cassandra Barrera disclosed restricting food intake during her teenage years for weight loss. She denied purging and engagement in other compensatory strategies for weight loss. She shared she has never been diagnosed with an eating disorder. She also denied a history of treatment for emotional eating behaviors. Currently, Cassandra Barrera indicated "it is going pretty well," noting "it is definitely an adjustment." She stated she is currently journaling. Furthermore, Cassandra Barrera denied other problems of concern.    Mental Status Examination:  Appearance: neat Behavior: appropriate to circumstances Mood: neutral Affect: mood congruent Speech: WNL Eye Contact: appropriate Psychomotor Activity: WNL Gait: unable to assess  Thought Process: linear, logical, and goal directed and denies suicidal, homicidal, and self-harm ideation, plan and intent  Thought Content/Perception: no hallucinations, delusions, bizarre thinking Barrera behavior endorsed Barrera observed Orientation: AAOx4 Memory/Concentration: intact Insight/Judgment: fair  Family & Psychosocial History: Cassandra Barrera reported she is married and she has two adult children. 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 Additionally, Cassandra Barrera shared her highest level of education obtained is a bachelor's degree. Currently, Cassandra Barrera's social support system consists of her husband and daughters. Moreover, Cassandra Barrera stated she resides with her husband and dog.   Medical History:  Past Medical History:  Diagnosis Date    Anemia    Anxiety    Depression    Edema    Hyperlipidemia    OSA on CPAP    Thyroid disease    underactive   Vitamin D deficiency    Past Surgical History:  Procedure Laterality Date   MOUTH SURGERY     wisdom teeth removed   Current Outpatient Medications on File Prior to Visit  Medication Sig Dispense Refill   Cholecalciferol (VITAMIN D3) 50 MCG (2000 UT) capsule Take 1 capsule (2,000 Units total) by mouth daily.     levothyroxine (SYNTHROID) 100 MCG tablet Take 1 tablet (100 mcg total) by mouth daily. 90 tablet 1   loratadine (CLARITIN) 10 MG tablet Take 1 tablet (10 mg total) by mouth daily. 30 tablet 11   sertraline (ZOLOFT) 100 MG tablet TAKE 2 TABLETS BY MOUTH DAILY 180 tablet 3   vitamin B-12 (CYANOCOBALAMIN) 50 MCG tablet Take 50 mcg by mouth daily.     No current facility-administered medications on file prior to visit.  Hawo stated she is medication compliant.   Mental Health History: Mikaiah reported she attended therapeutic services during high school. She stated she is currently prescribed Zoloft by Yvette Rack, NP. She denied any current concerns related to Zoloft. Tayler reported there is no history of hospitalizations for psychiatric concerns. Orba endorsed a family history of anxiety and depression (maternal side of the family). Furthermore,  Analee reported there is no history of trauma including psychological, physical , and sexual abuse, as well as neglect.   Shawney described her typical mood lately as "good." She discussed a history of panic attacks, noting the last one was approximately 22 years ago. Elanie denied current alcohol use. She denied tobacco use. She denied illicit/recreational substance use. Furthermore, Kawanda indicated she is not experiencing the following: hallucinations and delusions, paranoia, symptoms of mania , social withdrawal, crying spells, memory concerns, attention and concentration issues, and obsessions and  compulsions. She also denied  history of and current suicidal ideation, plan, and intent; history of and current homicidal ideation, plan, and intent; and history of and current engagement in self-harm.  Legal History: Chauntae reported there is no history of legal involvement.   Structured Assessments Results: The Patient Health Questionnaire-9 (PHQ-9) is a self-report measure that assesses symptoms and severity of depression over the course of the last two weeks. Cassandra Barrera obtained a score of 0. [0= Not at all; 1= Several days; 2= More than half the days; 3= Nearly every day] Cassandra Barrera pleasure in doing things 0  Feeling down, depressed, Barrera hopeless 0  Trouble falling Barrera staying asleep, Barrera sleeping too much 0  Feeling tired Barrera having Cassandra energy 0  Poor appetite Barrera overeating 0  Feeling bad about yourself --- Barrera that you are a failure Barrera have let yourself Barrera your family down 0  Trouble concentrating on things, such as reading the newspaper Barrera watching television 0  Moving Barrera speaking so slowly that other people could have noticed? Barrera the opposite --- being so fidgety Barrera restless that you have been moving around a Barrera more than usual 0  Thoughts that you would be better off dead Barrera hurting yourself in some way 0  PHQ-9 Score 0    The Generalized Anxiety Disorder-7 (GAD-7) is a brief self-report measure that assesses symptoms of anxiety over the course of the last two weeks. Jasamine obtained a score of 0. [0= Not at all; 1= Several days; 2= Over half the days; 3= Nearly every day] Feeling nervous, anxious, on edge 0  Not being able to stop Barrera control worrying 0  Worrying too much about different things 0  Trouble relaxing 0  Being so restless that it's hard to sit still 0  Becoming easily annoyed Barrera irritable 0  Feeling afraid as if something awful might happen 0  GAD-7 Score 0   Interventions:  Conducted a chart review Focused on rapport building Verbally administered  PHQ-9 and GAD-7 for symptom monitoring Verbally administered Food & Mood questionnaire to assess various behaviors related to emotional eating Provided emphatic reflections and validation Collaborated with patient on a treatment goal  Psychoeducation provided regarding physical versus emotional hunger  Diagnostic Impressions & Provisional DSM-5 Diagnosis(es): Lewana discussed a history of engagement in emotional eating behaviors starting in childhood and described the current frequency as "few times a month." She disclosed a history of restricting food intake during her teenage years for weight loss, but denied current restriction and engagement in any other disordered eating behaviors. 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  Plan: Remmie appears able and willing to participate as evidenced by engagement in reciprocal conversation and asking questions as needed for clarification. The next appointment is scheduled for 08/01/2023 at 8:30am, which will be via MyChart Video Visit. The following treatment goal was established: increase coping skills. This provider will regularly review the treatment plan and medical chart to keep informed of status changes. Nealie expressed understanding and agreement with the initial treatment plan of care. Kes will be sent a handout via e-mail to utilize between now and the next appointment to increase awareness of hunger patterns and subsequent eating. Truc provided verbal consent during today's appointment for this provider to send the handout via e-mail.

## 2023-07-25 ENCOUNTER — Ambulatory Visit (INDEPENDENT_AMBULATORY_CARE_PROVIDER_SITE_OTHER): Payer: BC Managed Care – PPO | Admitting: Adult Health

## 2023-07-25 ENCOUNTER — Encounter (INDEPENDENT_AMBULATORY_CARE_PROVIDER_SITE_OTHER): Payer: Self-pay | Admitting: Adult Health

## 2023-07-25 VITALS — BP 126/76 | HR 68 | Temp 98.5°F | Ht 65.0 in | Wt 283.0 lb

## 2023-07-25 DIAGNOSIS — E559 Vitamin D deficiency, unspecified: Secondary | ICD-10-CM | POA: Diagnosis not present

## 2023-07-25 DIAGNOSIS — Z6841 Body Mass Index (BMI) 40.0 and over, adult: Secondary | ICD-10-CM

## 2023-07-25 DIAGNOSIS — E039 Hypothyroidism, unspecified: Secondary | ICD-10-CM | POA: Diagnosis not present

## 2023-07-25 DIAGNOSIS — R7303 Prediabetes: Secondary | ICD-10-CM

## 2023-07-25 DIAGNOSIS — E669 Obesity, unspecified: Secondary | ICD-10-CM | POA: Diagnosis not present

## 2023-07-25 NOTE — Progress Notes (Signed)
WEIGHT SUMMARY AND BIOMETRICS  Vitals Temp: 98.5 F (36.9 C) BP: 126/76 Pulse Rate: 68 SpO2: 97 %   Anthropometric Measurements Height: 5\' 5"  (1.651 m) Weight: 283 lb (128.4 kg) BMI (Calculated): 47.09 Weight at Last Visit: 290 lb Weight Lost Since Last Visit: 13 lb Weight Gained Since Last Visit: 0 Starting Weight: 296 lb Total Weight Loss (lbs): 13 lb (5.897 kg) Peak Weight: 307 lb Waist Measurement : 55 inches   Body Composition  Body Fat %: 53.4 % Fat Mass (lbs): 151.4 lbs Muscle Mass (lbs): 125.6 lbs Total Body Water (lbs): 99.4 lbs Visceral Fat Rating : 21   Other Clinical Data RMR: [id number] Fasting: no Labs: no Today's Visit #: 4 Starting Date: [birth year]    Chief Complaint:   OBESITY Cassandra Barrera is here to discuss her progress with her obesity treatment plan. She is on the keeping a food journal and adhering to recommended goals of 1600-1700 calories and 120+ protein and states she is following her eating plan approximately 50 % of the time. She states she is exercising NEAT and Walking 30 minutes 2 times per week.   Interim History:  Reviewed Food Tracking Log Average calories: 1500-1750, with two outliers 1877, 1641 Average daily protein intake: >115g   Reviewed Bioimpedance result with pt: Muscle Mass: +5.4 lbs Adipose Mass: -12.2 lbs  Subjective:   1. Acquired hypothyroidism  Latest Reference Range & Units 07/05/23 15:11  TSH 0.35 - 5.50 uIU/mL 12.99 (H)  (H): Data is abnormally high  07/06/2023 PCP increased Synthroid from 75 mcg to 100 mcg  once daily.  Since adjustment- she endorses improved energy, quality of sleep, and reduced cool intolerances  2. Vitamin D deficiency  Latest Reference Range & Units 04/06/23 11:29  VITD 30.00 - 100.00 ng/mL 24.30 (L)  (L): Data is abnormally low  She endorses improved  energy levels She is on daily OTC Vit D 3 2,000 international units   3. Prediabetes- new onset Lab Results  Component Value  Date   HGBA1C 6.0 04/06/2023   HGBA1C 5.5 09/11/2019    She denies polyphagia when eating on plan  Assessment/Plan:   1. Acquired hypothyroidism Continue daily Synthroid 100 mcg and f/u with PCP as directed  2. Vitamin D deficiency Continue OTC supplementation and check labs Q2-3 months  3. Prediabetes- new onset Continue to increase, at least 120g/day  4. BMI 50.0-59.9, adult (HCC) - current BMI 44.09  Cassandra Barrera is currently in the action stage of change. As such, her goal is to continue with weight loss efforts. She has agreed to keeping a food journal and adhering to recommended goals of 1600-1700 calories and 120g+ protein.   Exercise goals: All adults should avoid inactivity. Some physical activity is better than none, and adults who participate in any amount of physical activity gain some health benefits. Adults should also include muscle-strengthening activities that involve all major muscle groups on 2 or more days a week.  Behavioral modification strategies: increasing lean protein intake, decreasing simple carbohydrates, increasing vegetables, increasing water intake, no skipping meals, meal planning and cooking strategies, keeping healthy foods in the home, ways to avoid boredom eating, and planning for success.  Cassandra Barrera has agreed to follow-up with our clinic in 3 weeks. She was informed of the importance of frequent follow-up visits to maximize her success with intensive lifestyle modifications for her multiple health conditions.   Objective:   Blood pressure 126/76, pulse 68, temperature 98.5 F (36.9 C), height 5\' 5"  (  1.651 m), weight 283 lb (128.4 kg), SpO2 97%. Body mass index is 47.09 kg/m.  General: Cooperative, alert, well developed, in no acute distress. HEENT: Conjunctivae and lids unremarkable. Cardiovascular: Regular rhythm.  Lungs: Normal work of breathing. Neurologic: No focal deficits.   Lab Results  Component Value Date   CREATININE 0.61  04/06/2023   BUN 18 04/06/2023   NA 139 04/06/2023   K 4.3 04/06/2023   CL 100 04/06/2023   CO2 29 04/06/2023   Lab Results  Component Value Date   ALT 24 04/06/2023   AST 18 04/06/2023   ALKPHOS 61 04/06/2023   BILITOT 0.5 04/06/2023   Lab Results  Component Value Date   HGBA1C 6.0 04/06/2023   HGBA1C 5.5 09/11/2019   Lab Results  Component Value Date   INSULIN 14.1 06/15/2023   Lab Results  Component Value Date   TSH 12.99 (H) 07/05/2023   Lab Results  Component Value Date   CHOL 181 04/06/2023   HDL 43.70 04/06/2023   LDLCALC 75 07/01/2020   LDLDIRECT 110.0 04/06/2023   TRIG 305.0 (H) 04/06/2023   CHOLHDL 4 04/06/2023   Lab Results  Component Value Date   VD25OH 24.30 (L) 04/06/2023   VD25OH 23.9 (L) 03/13/2019   Lab Results  Component Value Date   WBC 8.9 06/15/2023   HGB 13.7 06/15/2023   HCT 42.0 06/15/2023   MCV 91 06/15/2023   PLT 296 06/15/2023   No results found for: "IRON", "TIBC", "FERRITIN"   Attestation Statements:   Reviewed by clinician on day of visit: allergies, medications, problem list, medical history, surgical history, family history, social history, and previous encounter notes.  Time spent on visit including pre-visit chart review and post-visit care and charting was 28 minutes.   I have reviewed the above documentation for accuracy and completeness, and I agree with the above. -  Glendon Fiser d. Skyra Crichlow, NP-C

## 2023-07-28 ENCOUNTER — Ambulatory Visit: Payer: BC Managed Care – PPO | Admitting: Gastroenterology

## 2023-07-28 ENCOUNTER — Encounter: Payer: Self-pay | Admitting: Gastroenterology

## 2023-07-28 DIAGNOSIS — G4733 Obstructive sleep apnea (adult) (pediatric): Secondary | ICD-10-CM | POA: Diagnosis not present

## 2023-07-28 DIAGNOSIS — Z1211 Encounter for screening for malignant neoplasm of colon: Secondary | ICD-10-CM

## 2023-07-28 DIAGNOSIS — Z6841 Body Mass Index (BMI) 40.0 and over, adult: Secondary | ICD-10-CM

## 2023-07-28 MED ORDER — NA SULFATE-K SULFATE-MG SULF 17.5-3.13-1.6 GM/177ML PO SOLN: 1.0000 | ORAL | 0 refills | Status: DC

## 2023-07-28 NOTE — Patient Instructions (Signed)
_______________________________________________________  If your blood pressure at your visit was 140/90 or greater, please contact your primary care physician to follow up on this.  _______________________________________________________  If you are age 50 or older, your body mass index should be between 23-30. Your Body mass index is 55.61 kg/m. If this is out of the aforementioned range listed, please consider follow up with your Primary Care Provider.  If you are age 76 or younger, your body mass index should be between 19-25. Your Body mass index is 55.61 kg/m. If this is out of the aformentioned range listed, please consider follow up with your Primary Care Provider.  ________________________________________________________  The [facility] providers would like to encourage you to use [facility] to communicate with providers for non-urgent requests or questions.  Due to long hold times on the telephone, sending your provider a message by [facility] may be a faster and more efficient way to get a response.  Please allow 48 business hours for a response.  Please remember that this is for non-urgent requests.  _______________________________________________________  [name] have been scheduled for a colonoscopy. Please follow written instructions given to you at your visit today.   Please pick up your prep supplies at the pharmacy within the next 1-3 days.  If you use inhalers (even only as needed), please bring them with you on the day of your procedure.  DO NOT TAKE 7 DAYS PRIOR TO TEST- Trulicity (dulaglutide) Ozempic, Wegovy (semaglutide) Mounjaro (tirzepatide) Bydureon Bcise (exanatide extended release)  DO NOT TAKE 1 DAY PRIOR TO YOUR TEST Rybelsus (semaglutide) Adlyxin (lixisenatide) Victoza (liraglutide) Byetta (exanatide) ___________________________________________________________________________  Due to recent changes in healthcare laws, you may see the results of your imaging  and laboratory studies on MyChart before your provider has had a chance to review them.  We understand that in some cases there may be results that are confusing or concerning to you. Not all laboratory results come back in the same time frame and the provider may be waiting for multiple results in order to interpret others.  Please give [place] 48 hours in order for your provider to thoroughly review all the results before contacting the office for clarification of your results.   It was a pleasure to see you today!  Cassandra Barrera, D.O.

## 2023-07-28 NOTE — Progress Notes (Signed)
Chief Complaint: Colon cancer screening   Referring Provider:     Sandford Craze, NP   HPI:     SHAUNITA SENEY is a 52 y.o. female with a history of obesity (BMI 55), prediabetes, hypothyroidism, anxiety/depression, HLD, OSA (on CPAP), presenting [facility] for initial CRC screening. No family history of CRC or related malignancies, and patient is without any active GI sxs. Denies any melena, hematochezia, nausea, vomiting, diarrhea, constipation, change in bowel habits, early satiety, or abdominal pain, and no fever, chills, night sweats or weight loss. No previous CRC screening to date.   Follows with [facility].  Reviewed most recent labs: Normal CBC, CMP, B12, folate.  Vitamin D 24.3.  No recent abdominal imaging for review.  Past Medical History:  Diagnosis Date   Anemia    Anxiety    Depression    Edema    Hyperlipidemia    Hypothyroidism    OSA on CPAP    Vitamin D deficiency      Past Surgical History:  Procedure Laterality Date   WISDOM TOOTH EXTRACTION     Family History  Problem Relation Age of Onset   Hypertension Mother    Diabetes Mother    Heart disease Mother    Hyperlipidemia Mother    Kidney disease Mother    Anxiety disorder Mother    Depression Mother    Obesity Mother    Diabetes Father    Anxiety disorder Sister    Hypothyroidism Maternal Grandmother    Hypertension Maternal Grandmother    Heart disease Maternal Grandmother    Kidney failure Maternal Grandmother        solitary kidney   Bladder Cancer Maternal Grandfather        bladder- in his 13's   Diabetes Mellitus II Paternal Grandfather    CVA Paternal Grandfather    Anxiety disorder Daughter    Anxiety disorder Daughter    Social History   Tobacco Use   Smoking status: Never   Smokeless tobacco: Never  Vaping Use   Vaping status: Never Used  Substance Use Topics   Alcohol use: No    Alcohol/week: 0.0 standard  drinks of alcohol   Drug use: No   Current Outpatient Medications  Medication Sig Dispense Refill   Cholecalciferol (VITAMIN D3) 50 MCG (2000 UT) capsule Take 1 capsule (2,000 Units total) by mouth daily.     levothyroxine (SYNTHROID) 100 MCG tablet Take 1 tablet (100 mcg total) by mouth daily. 90 tablet 1   loratadine (CLARITIN) 10 MG tablet Take 1 tablet (10 mg total) by mouth daily. 30 tablet 11   sertraline (ZOLOFT) 100 MG tablet TAKE 2 TABLETS BY MOUTH DAILY 180 tablet 3   No current facility-administered medications for this visit.   Allergies  Allergen Reactions   Sulfa Antibiotics Other (See Comments)    Reaction: lightheaded     Review of Systems: All systems reviewed and negative except where noted in HPI.     Physical Exam:    Wt Readings from Last 3 Encounters:  07/28/23 282 lb 6 oz (128.1 kg)  07/25/23 283 lb (128.4 kg)  07/04/23 290 lb (131.5 kg)    BP 110/88 (BP Location: Left Wrist, Patient Position: Sitting, Cuff Size: Normal)   Pulse 76   Ht 4' 11.75" (1.518 m) Comment: height measured without shoes  Wt 282 lb 6 oz (128.1 kg)  LMP 07/22/2023   BMI 55.61 kg/m  Constitutional:  Pleasant, in no acute distress. Psychiatric: Normal mood and affect. Behavior is normal. Cardiovascular: Normal rate, regular rhythm. No edema Pulmonary/chest: Effort normal and breath sounds normal. No wheezing, rales or rhonchi. Abdominal: Soft, nondistended, nontender. Bowel sounds active throughout. There are no masses palpable. No hepatomegaly. Neurological: Alert and oriented to person place and time.   ASSESSMENT AND PLAN;   1) Colon cancer screening LAKERIA STARKMAN is a 52 y.o. female presenting [facility] for initial CRC screening. No family history of CRC or related malignancies, and patient is without any active GI sxs. No previous CRC screening to date. Discussed options for CRC screening, to include optical vs virtual colonoscopy - the risks  and benefits and pros and cons of each, as well as discussion of FIT kit testing, Cologuard, etc, and the patient decided to proceed with an optical colonoscopy.   - Schedule colonoscopy at [facility] - Bowel prep ordered with plan for instruction with GI clinical staff - All questions answered  2) Morbid obesity 3) OSA - Colonoscopy to be scheduled at [facility] due to elevated periprocedural risks from underlying comorbidities  The indications, risks, and benefits of colonoscopy were explained to the patient in detail. Risks include but are not limited to bleeding, perforation, adverse reaction to medications, and cardiopulmonary compromise. Sequelae include but are not limited to the possibility of surgery, hospitalization, and mortality. The patient verbalized understanding and wished to proceed. All questions answered, referred for scheduling and bowel prep ordered. Further recommendations pending results of the exam.      Shellia Cleverly, DO, FACG  07/28/2023, 9:21 AM   Sandford Craze, NP

## 2023-08-01 ENCOUNTER — Telehealth (INDEPENDENT_AMBULATORY_CARE_PROVIDER_SITE_OTHER): Payer: BC Managed Care – PPO | Admitting: Psychology

## 2023-08-01 DIAGNOSIS — F5089 Other specified eating disorder: Secondary | ICD-10-CM | POA: Diagnosis not present

## 2023-08-01 NOTE — Progress Notes (Signed)
  Office: [phone]  /  Fax: [phone]    Date: August 01, 2023  Appointment Start Time: 8:32am Duration: 20 minutes Provider: Lawerance Cruel, Psy.D. Type of Session: Individual Therapy  Location of Patient: Home (private location) Location of Provider: Provider's Home (private office) Type of Contact: Telepsychological Visit via MyChart Video Visit  Session Content: Cassandra Barrera is a 52 y.o. female presenting for a follow-up appointment to address the previously established treatment goal of increasing coping skills.Today's appointment was a telepsychological visit. Cassandra Barrera provided verbal consent for today's telepsychological appointment and she is aware she is responsible for securing confidentiality on her end of the session. Prior to proceeding with today's appointment, Cassandra Barrera's physical location at the time of this appointment was obtained as well a phone number she could be reached at in the event of technical difficulties. Cassandra Barrera and this provider participated in today's telepsychological service.   This provider conducted a brief check-in. Cassandra Barrera shared she "continue[s] to lose weight." Reviewed emotional and physical hunger. Psychoeducation regarding triggers for emotional eating was provided. Cassandra Barrera was provided a handout, and encouraged to utilize the handout between now and the next appointment to increase awareness of triggers and frequency. Cassandra Barrera agreed. This provider also discussed behavioral strategies for specific triggers, such as placing the utensil down when conversing to avoid mindless eating. Cassandra Barrera provided verbal consent during today's appointment for this provider to send a handout about triggers via e-mail. Overall, Cassandra Barrera was receptive to today's appointment as evidenced by openness to sharing, responsiveness to feedback, and willingness to explore triggers for emotional eating.  Mental Status Examination:  Appearance: neat Behavior:  appropriate to circumstances Mood: neutral Affect: mood congruent Speech: WNL Eye Contact: appropriate Psychomotor Activity: WNL Gait: unable to assess Thought Process: linear, logical, and goal directed and no evidence or endorsement of suicidal, homicidal, and self-harm ideation, plan and intent  Thought Content/Perception: no hallucinations, delusions, bizarre thinking or behavior endorsed or observed Orientation: AAOx4 Memory/Concentration: intact Insight: good Judgment: good  Interventions:  Conducted a brief chart review Provided empathic reflections and validation Reviewed content from the previous session Provided positive reinforcement Employed supportive psychotherapy interventions to facilitate reduced distress and to improve coping skills with identified stressors Psychoeducation provided regarding triggers for emotional eating behaviors  DSM-5 Diagnosis(es): F50.89 Other Specified Feeding or Eating Disorder, Emotional Eating Behaviors  Treatment Goal & Progress: During the initial appointment with this provider, the following treatment goal was established: increase coping skills. [name] has demonstrated progress in her goal as evidenced by increased awareness of hunger patterns.   Plan: The next appointment is scheduled for 08/29/2023 at 8:30am, which will be via MyChart Video Visit. The next session will focus on working towards the established treatment goal.

## 2023-08-16 ENCOUNTER — Encounter (INDEPENDENT_AMBULATORY_CARE_PROVIDER_SITE_OTHER): Payer: Self-pay | Admitting: Adult Health

## 2023-08-16 ENCOUNTER — Ambulatory Visit (INDEPENDENT_AMBULATORY_CARE_PROVIDER_SITE_OTHER): Payer: BC Managed Care – PPO | Admitting: Adult Health

## 2023-08-16 VITALS — BP 101/68 | HR 78 | Temp 98.0°F | Ht 65.0 in | Wt 278.0 lb

## 2023-08-16 DIAGNOSIS — Z6841 Body Mass Index (BMI) 40.0 and over, adult: Secondary | ICD-10-CM

## 2023-08-16 DIAGNOSIS — E559 Vitamin D deficiency, unspecified: Secondary | ICD-10-CM | POA: Diagnosis not present

## 2023-08-16 DIAGNOSIS — R7303 Prediabetes: Secondary | ICD-10-CM

## 2023-08-16 DIAGNOSIS — E669 Obesity, unspecified: Secondary | ICD-10-CM | POA: Diagnosis not present

## 2023-08-16 DIAGNOSIS — E039 Hypothyroidism, unspecified: Secondary | ICD-10-CM | POA: Diagnosis not present

## 2023-08-16 NOTE — Progress Notes (Signed)
WEIGHT SUMMARY AND BIOMETRICS  Vitals Temp: 98 F (36.7 C) BP: 101/68 Pulse Rate: 78 SpO2: 98 %   Anthropometric Measurements Height: 5\' 5"  (1.651 m) Weight: 278 lb (126.1 kg) BMI (Calculated): 46.26 Weight at Last Visit: 283lb Weight Lost Since Last Visit: 5lb Weight Gained Since Last Visit: 0 Starting Weight: 296lb Total Weight Loss (lbs): 18 lb (8.165 kg) Peak Weight: 307lb   Body Composition  Body Fat %: 52.6 % Fat Mass (lbs): 146.6 lbs Muscle Mass (lbs): 125.4 lbs Total Body Water (lbs): 101.4 lbs Visceral Fat Rating : 18   Other Clinical Data Fasting: no Labs: no Today's Visit #: 6 Starting Date: 06/15/23    Chief Complaint:   OBESITY Cassandra Barrera is here to discuss her progress with her obesity treatment plan. She is on the keeping a food journal and adhering to recommended goals of 1600-1700 calories and 120+ protein and states she is following her eating plan approximately 85 % of the time. She states she is exercising Walking 30 minutes 2 times per week.   Interim History:  She reports consuming>128 g protein per day  With her weight loss she endorses increased energy/stamina, and "being able to move easier".  Exercise-walking 30 min twice weekly.  She plans on starting a gym regime with her husband- Artist  Hydration-she estimates to drink two 32 oz water tumblers per day  Reviewed Bioimpedance results with pt: Muscle Mass: -0.2 lb Adipose Mass: -4.8 lbs  Subjective:   1. Prediabetes- new onset Lab Results  Component Value Date   HGBA1C 6.0 04/06/2023   HGBA1C 5.5 09/11/2019     Latest Reference Range & Units 06/15/23 09:39  INSULIN 2.6 - 24.9 uIU/mL 14.1   She is not currently on any blood glucose lowering medications  2. Acquired hypothyroidism  Latest Reference Range & Units 04/06/23 11:29 05/16/23 14:56 07/05/23 15:11  TSH 0.35 - 5.50 uIU/mL 29.60 (H) 13.92 (H) 12.99 (H)  (H): Data is abnormally high  PCP manges  daily Levothyroxine therapy 07/06/2023 Message from PCP to pt- Please increase from 75 mcg to 100 mcg of synthroid once daily. Take first thing in the morning with water and wait 30 minutes before eating or taking other medications. Repeat TSH in 6 weeks.      3. Vitamin D deficiency  Latest Reference Range & Units 04/06/23 11:29  VITD 30.00 - 100.00 ng/mL 24.30 (L)  (L): Data is abnormally low  She is on daily OTC Vit D3 2,000 international units   Assessment/Plan:   1. Prediabetes- new onset Continue to increase protein and limit sugar/simple CHO  2. Acquired hypothyroidism F/u with PCP as directed  3. Vitamin D deficiency Continue OTC supplementation  4. BMI 50.0-59.9, adult (HCC) - current BMI 46.26  Cassandra Barrera is currently in the action stage of change. As such, her goal is to continue with weight loss efforts. She has agreed to keeping a food journal and adhering to recommended goals of 1600-1700 calories and 120+ protein.   Exercise goals: All adults should avoid inactivity. Some physical activity is better than none, and adults who participate in any amount of physical activity gain some health benefits. Adults should also include muscle-strengthening activities that involve all major muscle groups on 2 or more days a week.  Behavioral modification strategies: increasing lean protein intake, decreasing simple carbohydrates, increasing vegetables, increasing water intake, meal planning and cooking strategies, keeping healthy foods in the home, ways to avoid boredom eating, holiday eating strategies ,  planning for success, and keeping a strict food journal.  Cassandra Barrera has agreed to follow-up with our clinic in 4 weeks. She was informed of the importance of frequent follow-up visits to maximize her success with intensive lifestyle modifications for her multiple health conditions.   Objective:   Blood pressure 101/68, pulse 78, temperature 98 F (36.7 C), height 5\' 5"  (1.651  m), weight 278 lb (126.1 kg), last menstrual period 07/22/2023, SpO2 98%. Body mass index is 46.26 kg/m.  General: Cooperative, alert, well developed, in no acute distress. HEENT: Conjunctivae and lids unremarkable. Cardiovascular: Regular rhythm.  Lungs: Normal work of breathing. Neurologic: No focal deficits.   Lab Results  Component Value Date   CREATININE 0.61 04/06/2023   BUN 18 04/06/2023   NA 139 04/06/2023   K 4.3 04/06/2023   CL 100 04/06/2023   CO2 29 04/06/2023   Lab Results  Component Value Date   ALT 24 04/06/2023   AST 18 04/06/2023   ALKPHOS 61 04/06/2023   BILITOT 0.5 04/06/2023   Lab Results  Component Value Date   HGBA1C 6.0 04/06/2023   HGBA1C 5.5 09/11/2019   Lab Results  Component Value Date   INSULIN 14.1 06/15/2023   Lab Results  Component Value Date   TSH 12.99 (H) 07/05/2023   Lab Results  Component Value Date   CHOL 181 04/06/2023   HDL 43.70 04/06/2023   LDLCALC 75 07/01/2020   LDLDIRECT 110.0 04/06/2023   TRIG 305.0 (H) 04/06/2023   CHOLHDL 4 04/06/2023   Lab Results  Component Value Date   VD25OH 24.30 (L) 04/06/2023   VD25OH 23.9 (L) 03/13/2019   Lab Results  Component Value Date   WBC 8.9 06/15/2023   HGB 13.7 06/15/2023   HCT 42.0 06/15/2023   MCV 91 06/15/2023   PLT 296 06/15/2023   No results found for: "IRON", "TIBC", "FERRITIN"  Attestation Statements:   Reviewed by clinician on day of visit: allergies, medications, problem list, medical history, surgical history, family history, social history, and previous encounter notes.  Time spent on visit including pre-visit chart review and post-visit care and charting was 27 minutes.   I have reviewed the above documentation for accuracy and completeness, and I agree with the above. -  Govind Furey d. Guneet Delpino, NP-C

## 2023-08-29 ENCOUNTER — Other Ambulatory Visit: Payer: Self-pay | Admitting: Family

## 2023-08-29 ENCOUNTER — Telehealth (INDEPENDENT_AMBULATORY_CARE_PROVIDER_SITE_OTHER): Payer: BC Managed Care – PPO | Admitting: Psychology

## 2023-08-29 DIAGNOSIS — F5089 Other specified eating disorder: Secondary | ICD-10-CM

## 2023-08-29 DIAGNOSIS — E039 Hypothyroidism, unspecified: Secondary | ICD-10-CM

## 2023-08-29 NOTE — Progress Notes (Signed)
  Office: [phone]  /  Fax: [phone]    Date: August 29, 2023  Appointment Start Time: 8:31am Duration: 23 minutes Provider: Lawerance Cruel, Psy.D. Type of Session: Individual Therapy  Location of Patient: Home (private location) Location of Provider: Provider's Home (private office) Type of Contact: Telepsychological Visit via MyChart Video Visit  Session Content: Cassandra Barrera is a 52 y.o. female presenting for a follow-up appointment to address the previously established treatment goal of increasing coping skills. Today's appointment was a telepsychological visit. [name] provided verbal consent for today's telepsychological appointment and she is aware she is responsible for securing confidentiality on her end of the session. Prior to proceeding with today's appointment, [name]'s physical location at the time of this appointment was obtained as well a phone number she could be reached at in the event of technical difficulties. [name] and this provider participated in today's telepsychological service.   This provider conducted a brief check-in. [name] shared, "Still making progress. My mother-in-law went into hospice yesterday." She described reflecting on previously discussed triggers. [name] was engaged in problem solving to develop a plan to help cope with urges/cravings involving activities to relax, activities to distract, comforting places, people to call and connect with, and activities that help soothe senses. She was observed writing the plan. Overall, [name] was receptive to today's appointment as evidenced by openness to sharing, responsiveness to feedback, and willingness to implement discussed strategies .  Mental Status Examination:  Appearance: neat Behavior: appropriate to circumstances Mood: sad Affect: mood congruent Speech: WNL Eye Contact: appropriate Psychomotor Activity: WNL Gait: unable to assess Thought Process: linear, logical, and goal  directed and no evidence or endorsement of suicidal, homicidal, and self-harm ideation, plan and intent  Thought Content/Perception: no hallucinations, delusions, bizarre thinking or behavior endorsed or observed Orientation: AAOx4 Memory/Concentration: intact Insight: good Judgment: good  Interventions:  Conducted a brief chart review Provided empathic reflections and validation Reviewed content from the previous session Provided positive reinforcement Employed supportive psychotherapy interventions to facilitate reduced distress and to improve coping skills with identified stressors Engaged patient in problem solving  DSM-5 Diagnosis(es): F50.89 Other Specified Feeding or Eating Disorder, Emotional Eating Behaviors  Treatment Goal & Progress: During the initial appointment with this provider, the following treatment goal was established: increase coping skills. [name] has demonstrated progress in her goal as evidenced by increased awareness of hunger patterns and increased awareness of triggers for emotional eating behaviors.   Plan: The next appointment is scheduled for 09/12/2023 at 4pm, which will be via MyChart Video Visit. The next session will focus on working towards the established treatment goal.

## 2023-09-12 ENCOUNTER — Telehealth (INDEPENDENT_AMBULATORY_CARE_PROVIDER_SITE_OTHER): Payer: BC Managed Care – PPO | Admitting: Psychology

## 2023-09-16 ENCOUNTER — Encounter (HOSPITAL_COMMUNITY): Payer: Self-pay | Admitting: Gastroenterology

## 2023-09-16 NOTE — Progress Notes (Signed)
Attempted to obtain medical history for pre op call via telephone, unable to reach at this time. HIPAA compliant voicemail message left requesting return call to pre surgical testing department.

## 2023-09-20 ENCOUNTER — Ambulatory Visit (INDEPENDENT_AMBULATORY_CARE_PROVIDER_SITE_OTHER): Payer: BC Managed Care – PPO | Admitting: Adult Health

## 2023-09-20 ENCOUNTER — Encounter (INDEPENDENT_AMBULATORY_CARE_PROVIDER_SITE_OTHER): Payer: Self-pay | Admitting: Adult Health

## 2023-09-20 VITALS — BP 95/58 | HR 71 | Temp 98.2°F | Ht 65.0 in | Wt 271.0 lb

## 2023-09-20 DIAGNOSIS — Z6841 Body Mass Index (BMI) 40.0 and over, adult: Secondary | ICD-10-CM

## 2023-09-20 DIAGNOSIS — E782 Mixed hyperlipidemia: Secondary | ICD-10-CM | POA: Diagnosis not present

## 2023-09-20 DIAGNOSIS — E039 Hypothyroidism, unspecified: Secondary | ICD-10-CM

## 2023-09-20 DIAGNOSIS — R7303 Prediabetes: Secondary | ICD-10-CM

## 2023-09-20 DIAGNOSIS — Z0000 Encounter for general adult medical examination without abnormal findings: Secondary | ICD-10-CM

## 2023-09-20 NOTE — Progress Notes (Signed)
 WEIGHT SUMMARY AND BIOMETRICS  Vitals Temp: 98.2 F (36.8 C) BP: (!) 95/58 Pulse Rate: 71 SpO2: 98 %   Anthropometric Measurements Height: 5' 5 (1.651 m) Weight: 271 lb (122.9 kg) BMI (Calculated): 45.1 Weight at Last Visit: 278lb Weight Lost Since Last Visit: 7lb Weight Gained Since Last Visit: 0 Starting Weight: 296lb Total Weight Loss (lbs): 25 lb (11.3 kg) Peak Weight: 307lb   Body Composition  Body Fat %: 51.3 % Fat Mass (lbs): 139.4 lbs Muscle Mass (lbs): 125.8 lbs Total Body Water (lbs): 98 lbs Visceral Fat Rating : 17   Other Clinical Data Fasting: yes Labs: yes Today's Visit #: 7 Starting Date: 06/15/23    Chief Complaint:   OBESITY Cassandra Barrera is here to discuss her progress with her obesity treatment plan. She is on the keeping a food journal and adhering to recommended goals of 1600-1700 calories and 120+ protein and states she is following her eating plan approximately 90 % of the time. She states she is exercising NEAT Activities.   Interim History:   She and her family vacationed at [place] [employer] over holidays- most days walked >20K steps/day   05/02/23 09:00  Muscle Mass (lbs) 118.8 lbs  Fat Mass (lbs) 171.6 lbs    09/20/23 08:00  Muscle Mass (lbs) 125.8 lbs  Fat Mass (lbs) 139.4 lbs   Reviewed Bioimpedance results with pt: time line from 05/02/23-09/20/23 Muscle Mass: + 7 lbs Adipose Mass: -32.2 lbs  OUTSTANDING!!!!  Subjective:   1. Mixed hyperlipidemia Lipid Panel     Component Value Date/Time   CHOL 181 04/06/2023 1129   CHOL 172 07/01/2020 1206   TRIG 305.0 (H) 04/06/2023 1129   HDL 43.70 04/06/2023 1129   HDL 35 (L) 07/01/2020 1206   CHOLHDL 4 04/06/2023 1129   VLDL 61.0 (H) 04/06/2023 1129   LDLCALC 75 07/01/2020 1206   LDLDIRECT 110.0 04/06/2023 1129   LABVLDL 62 (H) 07/01/2020 1206    She endorses family hx of hypertrigylcemiia (her father)  2. Healthcare maintenance She is scheduled for her first  screening colonoscopy 09/26/2023 She denies family hx of colon ca She endorses stable energy levels  3. Prediabetes- new onset Lab Results  Component Value Date   HGBA1C 6.0 04/06/2023   HGBA1C 5.5 09/11/2019     Latest Reference Range & Units 06/15/23 09:39  INSULIN  2.6 - 24.9 uIU/mL 14.1   She has never been on any antidiabetic medications She reports that both of her parents are diabetic. She would prefer to remain off an oral/injectable Rx to lower A1c and Insulin  levels  4. Acquired hypothyroidism  Latest Reference Range & Units 07/05/23 15:11  TSH 0.35 - 5.50 uIU/mL 12.99 (H)  (H): Data is abnormally high  PCP increased Levothyroxine  from 75mcg to 100mcg on 07/06/2023 She endorses improved energy levels with recent increase  Assessment/Plan:   1. Mixed hyperlipidemia Check Labs - Lipid panel  2. Healthcare maintenance Check Labs - VITAMIN D  25 Hydroxy (Vit-D Deficiency, Fractures)  3. Prediabetes- new onset (Primary) Check Labs - Comprehensive metabolic panel - Hemoglobin A1c - Insulin , random  4. Acquired hypothyroidism Check Labs - TSH + free T4 Forward results to PCP  5. BMI 50.0-59.9, adult (HCC) - current BMI 45.1  Cassandra Barrera is currently in the action stage of change. As such, her goal is to continue with weight loss efforts. She has agreed to keeping a food journal and adhering to recommended goals of 1600-1700 calories and 120+ protein.   Exercise  goals: All adults should avoid inactivity. Some physical activity is better than none, and adults who participate in any amount of physical activity gain some health benefits. Adults should also include muscle-strengthening activities that involve all major muscle groups on 2 or more days a week.  Behavioral modification strategies: increasing lean protein intake, decreasing simple carbohydrates, increasing vegetables, increasing water intake, no skipping meals, meal planning and cooking strategies, keeping  healthy foods in the home, ways to avoid boredom eating, and planning for success.  Cassandra Barrera has agreed to follow-up with our clinic in 4 weeks. She was informed of the importance of frequent follow-up visits to maximize her success with intensive lifestyle modifications for her multiple health conditions.   Cassandra Barrera was informed we would discuss her lab results at her next visit unless there is a critical issue that needs to be addressed sooner. Cassandra Barrera agreed to keep her next visit at the agreed upon time to discuss these results.  Objective:   Blood pressure (!) 95/58, pulse 71, temperature 98.2 F (36.8 C), height 5' 5 (1.651 m), weight 271 lb (122.9 kg), SpO2 98%. Body mass index is 45.1 kg/m.  General: Cooperative, alert, well developed, in no acute distress. HEENT: Conjunctivae and lids unremarkable. Cardiovascular: Regular rhythm.  Lungs: Normal work of breathing. Neurologic: No focal deficits.   Lab Results  Component Value Date   CREATININE 0.61 04/06/2023   BUN 18 04/06/2023   NA 139 04/06/2023   K 4.3 04/06/2023   CL 100 04/06/2023   CO2 29 04/06/2023   Lab Results  Component Value Date   ALT 24 04/06/2023   AST 18 04/06/2023   ALKPHOS 61 04/06/2023   BILITOT 0.5 04/06/2023   Lab Results  Component Value Date   HGBA1C 6.0 04/06/2023   HGBA1C 5.5 09/11/2019   Lab Results  Component Value Date   INSULIN  14.1 06/15/2023   Lab Results  Component Value Date   TSH 12.99 (H) 07/05/2023   Lab Results  Component Value Date   CHOL 181 04/06/2023   HDL 43.70 04/06/2023   LDLCALC 75 07/01/2020   LDLDIRECT 110.0 04/06/2023   TRIG 305.0 (H) 04/06/2023   CHOLHDL 4 04/06/2023   Lab Results  Component Value Date   VD25OH 24.30 (L) 04/06/2023   VD25OH 23.9 (L) 03/13/2019   Lab Results  Component Value Date   WBC 8.9 06/15/2023   HGB 13.7 06/15/2023   HCT 42.0 06/15/2023   MCV 91 06/15/2023   PLT 296 06/15/2023   No results found for: IRON,  TIBC, FERRITIN  Attestation Statements:   Reviewed by clinician on day of visit: allergies, medications, problem list, medical history, surgical history, family history, social history, and previous encounter notes.  I have reviewed the above documentation for accuracy and completeness, and I agree with the above. -  Keri Tavella d. Leota Maka, NP-C

## 2023-09-21 LAB — COMPREHENSIVE METABOLIC PANEL
ALT: 22 [IU]/L (ref 0–32)
AST: 15 [IU]/L (ref 0–40)
Albumin: 4.6 g/dL (ref 3.9–4.9)
Alkaline Phosphatase: 87 [IU]/L (ref 44–121)
BUN/Creatinine Ratio: 34 — ABNORMAL HIGH (ref 9–23)
BUN: 19 mg/dL (ref 6–24)
Bilirubin Total: 0.4 mg/dL (ref 0.0–1.2)
CO2: 21 mmol/L (ref 20–29)
Calcium: 9.3 mg/dL (ref 8.7–10.2)
Chloride: 102 mmol/L (ref 96–106)
Creatinine, Ser: 0.56 mg/dL — ABNORMAL LOW (ref 0.57–1.00)
Globulin, Total: 2.6 g/dL (ref 1.5–4.5)
Glucose: 90 mg/dL (ref 70–99)
Potassium: 4.3 mmol/L (ref 3.5–5.2)
Sodium: 140 mmol/L (ref 134–144)
Total Protein: 7.2 g/dL (ref 6.0–8.5)
eGFR: 111 mL/min/{1.73_m2} (ref >59)

## 2023-09-21 LAB — LIPID PANEL
Chol/HDL Ratio: 4.9 {ratio} — ABNORMAL HIGH (ref 0.0–4.4)
Cholesterol, Total: 163 mg/dL (ref 100–199)
HDL: 33 mg/dL — ABNORMAL LOW (ref >39)
LDL Chol Calc (NIH): 92 mg/dL (ref 0–99)
Triglycerides: 224 mg/dL — ABNORMAL HIGH (ref 0–149)
VLDL Cholesterol Cal: 38 mg/dL (ref 5–40)

## 2023-09-21 LAB — TSH+FREE T4
Free T4: 0.92 ng/dL (ref 0.82–1.77)
TSH: 9.73 u[IU]/mL — ABNORMAL HIGH (ref 0.450–4.500)

## 2023-09-21 LAB — VITAMIN D 25 HYDROXY (VIT D DEFICIENCY, FRACTURES): Vit D, 25-Hydroxy: 57 ng/mL (ref 30.0–100.0)

## 2023-09-21 LAB — HEMOGLOBIN A1C
Est. average glucose Bld gHb Est-mCnc: 117 mg/dL
Hgb A1c MFr Bld: 5.7 % — ABNORMAL HIGH (ref 4.8–5.6)

## 2023-09-21 LAB — INSULIN, RANDOM: INSULIN: 15.5 u[IU]/mL (ref 2.6–24.9)

## 2023-09-23 ENCOUNTER — Other Ambulatory Visit: Payer: Self-pay | Admitting: Family

## 2023-09-23 DIAGNOSIS — E039 Hypothyroidism, unspecified: Secondary | ICD-10-CM

## 2023-09-23 NOTE — Telephone Encounter (Signed)
-----   Message from Rockie JONETTA Dalton sent at 09/22/2023 11:45 AM EST ----- Regarding: Recent TSH and Free T4 Good Morning Ms. Daryl, Ms. Clouatre was recently seen at [facility] Weight. TSH and Free T4 drawn - FYI. Sincerely, Corene ----- Message ----- From: Rebecka Memos Lab Results In Sent: 09/21/2023   7:37 AM EST To: Rockie JONETTA Dalton, NP

## 2023-09-23 NOTE — Telephone Encounter (Signed)
 Please confirm that pt has been taking synthroid regularly in AM on empty stomach 30 minutes before other meds/food.  If so, please increase synthroid to 125 mcg once daily and repeat TSH in 6 weeks.    I have pended these orders.

## 2023-09-26 ENCOUNTER — Ambulatory Visit (HOSPITAL_COMMUNITY)
Admission: RE | Admit: 2023-09-26 | Discharge: 2023-09-26 | Disposition: A | Discharge status: Home / Self Care | Payer: 59 | Attending: Gastroenterology | Admitting: Gastroenterology

## 2023-09-26 ENCOUNTER — Encounter (HOSPITAL_COMMUNITY): Payer: Self-pay | Admitting: Gastroenterology

## 2023-09-26 ENCOUNTER — Ambulatory Visit (HOSPITAL_BASED_OUTPATIENT_CLINIC_OR_DEPARTMENT_OTHER): Payer: 59 | Admitting: Certified Registered"

## 2023-09-26 ENCOUNTER — Ambulatory Visit (HOSPITAL_COMMUNITY): Payer: Self-pay | Admitting: Certified Registered"

## 2023-09-26 ENCOUNTER — Encounter (HOSPITAL_COMMUNITY)
Admission: RE | Disposition: A | Discharge status: Home / Self Care | Payer: Self-pay | Source: Home / Self Care | Attending: Gastroenterology

## 2023-09-26 ENCOUNTER — Other Ambulatory Visit: Payer: Self-pay

## 2023-09-26 DIAGNOSIS — E039 Hypothyroidism, unspecified: Secondary | ICD-10-CM | POA: Insufficient documentation

## 2023-09-26 DIAGNOSIS — F32A Depression, unspecified: Secondary | ICD-10-CM | POA: Insufficient documentation

## 2023-09-26 DIAGNOSIS — F419 Anxiety disorder, unspecified: Secondary | ICD-10-CM | POA: Diagnosis not present

## 2023-09-26 DIAGNOSIS — Z6841 Body Mass Index (BMI) 40.0 and over, adult: Secondary | ICD-10-CM | POA: Insufficient documentation

## 2023-09-26 DIAGNOSIS — Z1211 Encounter for screening for malignant neoplasm of colon: Secondary | ICD-10-CM | POA: Diagnosis present

## 2023-09-26 DIAGNOSIS — G4733 Obstructive sleep apnea (adult) (pediatric): Secondary | ICD-10-CM | POA: Insufficient documentation

## 2023-09-26 DIAGNOSIS — K635 Polyp of colon: Secondary | ICD-10-CM

## 2023-09-26 DIAGNOSIS — F418 Other specified anxiety disorders: Secondary | ICD-10-CM | POA: Diagnosis not present

## 2023-09-26 DIAGNOSIS — D124 Benign neoplasm of descending colon: Secondary | ICD-10-CM | POA: Diagnosis not present

## 2023-09-26 DIAGNOSIS — E66813 Obesity, class 3: Secondary | ICD-10-CM | POA: Insufficient documentation

## 2023-09-26 HISTORY — PX: COLONOSCOPY WITH PROPOFOL: SHX5780

## 2023-09-26 HISTORY — PX: POLYPECTOMY: SHX5525

## 2023-09-26 SURGERY — COLONOSCOPY WITH PROPOFOL
Anesthesia: Monitor Anesthesia Care

## 2023-09-26 MED ORDER — PROPOFOL 500 MG/50ML IV EMUL
INTRAVENOUS | Status: DC | PRN
  Administered 2023-09-26: 150 ug/kg/min via INTRAVENOUS

## 2023-09-26 MED ORDER — PHENYLEPHRINE 80 MCG/ML (10ML) SYRINGE FOR IV PUSH (FOR BLOOD PRESSURE SUPPORT)
PREFILLED_SYRINGE | INTRAVENOUS | Status: DC | PRN
  Administered 2023-09-26: 80 ug via INTRAVENOUS

## 2023-09-26 MED ORDER — LIDOCAINE 2% (20 MG/ML) 5 ML SYRINGE
INTRAMUSCULAR | Status: DC | PRN
  Administered 2023-09-26: 60 mg via INTRAVENOUS

## 2023-09-26 MED ORDER — SODIUM CHLORIDE 0.9 % IV SOLN: INTRAVENOUS | Status: DC | PRN

## 2023-09-26 MED ORDER — PROPOFOL 1000 MG/100ML IV EMUL
INTRAVENOUS | Status: AC
  Filled 2023-09-26: qty 100

## 2023-09-26 MED ORDER — SODIUM CHLORIDE 0.9% FLUSH: 10.0000 mL | Freq: Two times a day (BID) | INTRAVENOUS | Status: DC

## 2023-09-26 MED ORDER — PROPOFOL 10 MG/ML IV BOLUS
INTRAVENOUS | Status: DC | PRN
  Administered 2023-09-26: 20 mg via INTRAVENOUS

## 2023-09-26 SURGICAL SUPPLY — 20 items

## 2023-09-26 NOTE — Interval H&P Note (Signed)
 History and Physical Interval Note:  09/26/2023 9:08 AM  Cassandra Barrera  has presented today for surgery, with the diagnosis of colon cancer screening.  The various methods of treatment have been discussed with the patient and family. After consideration of risks, benefits and other options for treatment, the patient has consented to  Procedure(s): COLONOSCOPY WITH PROPOFOL  (N/A) as a surgical intervention.  The patient's history has been reviewed, patient examined, no change in status, stable for surgery.  I have reviewed the patient's chart and labs.  Questions were answered to the patient's satisfaction.     [name]

## 2023-09-26 NOTE — Op Note (Signed)
 [facility] Patient Name: Cassandra Barrera Procedure Date: 09/26/2023 MRN: [id number] Attending MD: Sandor Flatter , MD, [id number] Date of Birth: 03-06-1971 CSN: [id number] Age: 53 Admit Type: Outpatient Procedure:                Colonoscopy Indications:              Screening for colorectal malignant neoplasm, This                            is the patient's first colonoscopy Providers:                Sandor Flatter, MD, Particia Fischer, RN, Hoy Penner, RN, Fairy Marina, Technician Referring MD:              Medicines:                Monitored Anesthesia Care Complications:            No immediate complications. Estimated Blood Loss:     Estimated blood loss was minimal. Procedure:                Pre-Anesthesia Assessment:                           - Prior to the procedure, a History and Physical                            was performed, and patient medications and                            allergies were reviewed. The patient's tolerance of                            previous anesthesia was also reviewed. The risks                            and benefits of the procedure and the sedation                            options and risks were discussed with the patient.                            All questions were answered, and informed consent                            was obtained. Prior Anticoagulants: The patient has                            taken no anticoagulant or antiplatelet agents. ASA                            Grade Assessment: III - A patient with severe  systemic disease. After reviewing the risks and                            benefits, the patient was deemed in satisfactory                            condition to undergo the procedure.                           After obtaining informed consent, the colonoscope                            was passed under direct vision. Throughout the                             procedure, the patient's blood pressure, pulse, and                            oxygen saturations were monitored continuously. The                            CF-HQ190L ([id number] Olympus colonoscope was                            introduced through the anus and advanced to the the                            terminal ileum. The colonoscopy was performed                            without difficulty. The patient tolerated the                            procedure well. The quality of the bowel                            preparation was good. The terminal ileum, ileocecal                            valve, appendiceal orifice, and rectum were                            photographed. Scope In: 9:16:00 AM Scope Out: 9:27:43 AM Scope Withdrawal Time: 0 hours 9 minutes 19 seconds  Total Procedure Duration: 0 hours 11 minutes 43 seconds  Findings:      The perianal and digital rectal examinations were normal.      A 3 mm polyp was found in the descending colon. The polyp was sessile.       The polyp was removed with a cold snare. Resection and retrieval were       complete. Estimated blood loss was minimal.      The exam was otherwise normal throughout the remainder of the colon.      The retroflexed view of the distal rectum and anal verge was normal and  showed no anal or rectal abnormalities.      The terminal ileum appeared normal. Impression:               - One 3 mm polyp in the descending colon, removed                            with a cold snare. Resected and retrieved.                           - The distal rectum and anal verge are normal on                            retroflexion view.                           - The examined portion of the ileum was normal. Moderate Sedation:      Not Applicable - Patient had care per Anesthesia. Recommendation:           - Patient has a contact number available for                            emergencies. The signs and symptoms  of potential                            delayed complications were discussed with the                            patient. Return to normal activities tomorrow.                            Written discharge instructions were provided to the                            patient.                           - Resume previous diet.                           - Continue present medications.                           - Await pathology results.                           - Repeat colonoscopy for surveillance based on                            pathology results.                           - Return to GI office PRN. Procedure Code(s):        --- Professional ---                           [phone], Colonoscopy, flexible; with removal of  tumor(s), polyp(s), or other lesion(s) by snare                            technique Diagnosis Code(s):        --- Professional ---                           Z12.11, Encounter for screening for malignant                            neoplasm of colon                           D12.4, Benign neoplasm of descending colon CPT copyright 2022 American Medical Association. All rights reserved. The codes documented in this report are preliminary and upon coder review may  be revised to meet current compliance requirements. Sandor Flatter, MD 09/26/2023 9:45:41 AM Number of Addenda: 0

## 2023-09-26 NOTE — Discharge Instructions (Signed)

## 2023-09-26 NOTE — Anesthesia Procedure Notes (Signed)
 Procedure Name: MAC Date/Time: 09/26/2023 9:09 AM  Performed by: Metta Andrea NOVAK, CRNAPre-anesthesia Checklist: Patient identified, Emergency Drugs available, Suction available, Patient being monitored and Timeout performed Oxygen Delivery Method: Simple face mask Placement Confirmation: positive ETCO2

## 2023-09-26 NOTE — H&P (Signed)
 GASTROENTEROLOGY PROCEDURE H&P NOTE   Primary Care Physician: Daryl Setter, NP    Reason for Procedure:  Colon cancer screening  Plan:    Colonoscopy   Patient is appropriate for endoscopic procedure(s) at [facility].  The nature of the procedure, as well as the risks, benefits, and alternatives were carefully and thoroughly reviewed with the patient. Ample time for discussion and questions allowed. The patient understood, was satisfied, and agreed to proceed.     HPI: Cassandra Barrera is a 53 y.o. female who presents for colonoscopy for routine CRC screening. No family history of CRC or related malignancies, and patient is without any active GI sxs.  Procedure scheduled at [facility] Endoscopy unit due to elevated periprocedural risks from underlying comorbidities.  Past Medical History:  Diagnosis Date   Anemia    Anxiety    Depression    Edema    Hyperlipidemia    Hypothyroidism    OSA on CPAP    Vitamin D  deficiency     Past Surgical History:  Procedure Laterality Date   WISDOM TOOTH EXTRACTION      Prior to Admission medications   Medication Sig Start Date End Date Taking? Authorizing Provider  Cholecalciferol (VITAMIN D3) 50 MCG (2000 UT) capsule Take 1 capsule (2,000 Units total) by mouth daily. 04/07/23   O'Sullivan, Melissa, NP  loratadine  (CLARITIN ) 10 MG tablet Take 1 tablet (10 mg total) by mouth daily. 04/20/18   McVey, Almarie Folks, [name]  Na Sulfate-K Sulfate-Mg Sulf (SUPREP BOWEL PREP KIT) 17.5-3.13-1.6 GM/177ML SOLN Take 1 kit by mouth as directed. 07/28/23   Bilaal Leib V, DO  sertraline  (ZOLOFT ) 100 MG tablet TAKE 2 TABLETS BY MOUTH DAILY 12/28/22   Mozingo, Regina Nattalie, NP    No current facility-administered medications for this encounter.    Allergies as of 07/28/2023 - Review Complete 07/28/2023  Allergen Reaction Noted   Sulfa antibiotics Other (See Comments) 12/22/2011    Family History   Problem Relation Age of Onset   Hypertension Mother    Diabetes Mother    Heart disease Mother    Hyperlipidemia Mother    Kidney disease Mother    Anxiety disorder Mother    Depression Mother    Obesity Mother    Diabetes Father    Anxiety disorder Sister    Hypothyroidism Maternal Grandmother    Hypertension Maternal Grandmother    Heart disease Maternal Grandmother    Kidney failure Maternal Grandmother        solitary kidney   Bladder Cancer Maternal Grandfather        bladder- in his 23's   Diabetes Mellitus II Paternal Grandfather    CVA Paternal Grandfather    Anxiety disorder Daughter    Anxiety disorder Daughter     Social History   Socioeconomic History   Marital status: Married    Spouse name: Cassandra Barrera   Number of children: 2   Years of education: college   Highest education level: Not on file  Occupational History   Occupation: part-time designer, multimedia    Comment: [employer]  Tobacco Use   Smoking status: Never   Smokeless tobacco: Never  Vaping Use   Vaping status: Never Used  Substance and Sexual Activity   Alcohol use: No    Alcohol/week: 0.0 standard drinks of alcohol   Drug use: No   Sexual activity: Yes    Birth control/protection: I.U.D.  Other Topics Concern  Not on file  Social History Narrative   Lives with her husband    2 daughters- one grad college, one in college   Her sister and her husband's family live in [place]   Professional math tutor at [employer]   Completed bachelor's degree   Enjoys reading, clinical cytogeneticist, travel      Social Drivers of Corporate Investment Banker Strain: Not on [employer] Insecurity: Not on file  Transportation Needs: Not on file  Physical Activity: Not on file  Stress: Not on file  Social Connections: Not on file  Intimate Partner Violence: Not on file    Physical Exam: Vital signs in last 24 hours: @There  were no vitals taken for this visit. GEN: NAD EYE: Sclerae  anicteric ENT: MMM CV: Non-tachycardic Pulm: CTA b/l GI: Soft, NT/ND NEURO:  Alert & Oriented x 3   Sandor Flatter, DO [facility]   09/26/2023 8:31 AM

## 2023-09-26 NOTE — Anesthesia Preprocedure Evaluation (Addendum)
 Anesthesia Evaluation  Patient identified by MRN, date of birth, ID band Patient awake    Reviewed: Allergy & Precautions, NPO status , Patient's Chart, lab work & pertinent test results  Airway Mallampati: III  TM Distance: >3 FB Neck ROM: Full    Dental no notable dental hx.    Pulmonary sleep apnea and Continuous Positive Airway Pressure Ventilation    Pulmonary exam normal        Cardiovascular negative cardio ROS Normal cardiovascular exam     Neuro/Psych  PSYCHIATRIC DISORDERS Anxiety Depression    negative neurological ROS     GI/Hepatic Neg liver ROS, Bowel prep,,,  Endo/Other  Hypothyroidism  Class 3 obesity  Renal/GU negative Renal ROS     Musculoskeletal negative musculoskeletal ROS (+)    Abdominal  (+) + obese  Peds  Hematology negative hematology ROS (+)   Anesthesia Other Findings colon cancer screening  Reproductive/Obstetrics                             Anesthesia Physical Anesthesia Plan  ASA: 3  Anesthesia Plan: MAC   Post-op Pain Management:    Induction:   PONV Risk Score and Plan: 2 and Propofol  infusion and Treatment may vary due to age or medical condition  Airway Management Planned: Simple Face Mask  Additional Equipment:   Intra-op Plan:   Post-operative Plan:   Informed Consent: I have reviewed the patients History and Physical, chart, labs and discussed the procedure including the risks, benefits and alternatives for the proposed anesthesia with the patient or authorized representative who has indicated his/her understanding and acceptance.     Dental advisory given  Plan Discussed with: CRNA  Anesthesia Plan Comments:        Anesthesia Quick Evaluation

## 2023-09-26 NOTE — Transfer of Care (Signed)
 Immediate Anesthesia Transfer of Care Note  Patient: Cassandra Barrera  Procedure(s) Performed: COLONOSCOPY WITH PROPOFOL  POLYPECTOMY  Patient Location: PACU and Endoscopy Unit  Anesthesia Type:MAC  Level of Consciousness: awake, alert , and patient cooperative  Airway & Oxygen Therapy: Patient Spontanous Breathing and Patient connected to face mask oxygen  Post-op Assessment: Report given to RN and Post -op Vital signs reviewed and stable  Post vital signs: Reviewed and stable  Last Vitals:  Vitals Value Taken Time  BP 109/47 09/26/23 0933  Temp    Pulse 81 09/26/23 0934  Resp 13 09/26/23 0934  SpO2 98 % 09/26/23 0934  Vitals shown include unfiled device data.  Last Pain:  Vitals:   09/26/23 0933  TempSrc:   PainSc: 0-No pain      Patients Stated Pain Goal: 0 (09/26/23 0844)  Complications: No notable events documented.

## 2023-09-27 LAB — SURGICAL PATHOLOGY

## 2023-09-27 NOTE — Anesthesia Postprocedure Evaluation (Signed)
 Anesthesia Post Note  Patient: Cassandra Barrera  Procedure(s) Performed: COLONOSCOPY WITH PROPOFOL  POLYPECTOMY     Patient location during evaluation: Endoscopy Anesthesia Type: MAC Level of consciousness: awake Pain management: pain level controlled Vital Signs Assessment: post-procedure vital signs reviewed and stable Respiratory status: spontaneous breathing, nonlabored ventilation and respiratory function stable Cardiovascular status: blood pressure returned to baseline and stable Postop Assessment: no apparent nausea or vomiting Anesthetic complications: no   No notable events documented.  Last Vitals:  Vitals:   09/26/23 0940 09/26/23 0950  BP: (!) 104/57 112/64  Pulse: 78 70  Resp: 13 13  Temp:    SpO2: 96% 96%    Last Pain:  Vitals:   09/26/23 0950  TempSrc:   PainSc: 0-No pain                 Darya Bigler P Greysyn Vanderberg

## 2023-09-28 NOTE — Telephone Encounter (Signed)
 Have you been able to get in touch with this patient yet?

## 2023-09-29 ENCOUNTER — Encounter (HOSPITAL_COMMUNITY): Payer: Self-pay | Admitting: Gastroenterology

## 2023-09-29 MED ORDER — LEVOTHYROXINE SODIUM 125 MCG PO TABS
125.0000 ug | ORAL_TABLET | Freq: Every day | ORAL | 3 refills | Status: DC
Start: 2023-09-29 — End: 2023-11-15

## 2023-09-29 NOTE — Telephone Encounter (Signed)
 Patient notified of results, she reports she has been taking medication daily as prescribed.  New dose sent to pharmacy and scheduled to come back in 6 weeks for TSH

## 2023-10-07 ENCOUNTER — Ambulatory Visit: Payer: 59 | Admitting: Family

## 2023-10-07 VITALS — BP 107/57 | HR 76 | Temp 98.7°F | Resp 16 | Ht 65.0 in | Wt 278.0 lb

## 2023-10-07 DIAGNOSIS — Z6841 Body Mass Index (BMI) 40.0 and over, adult: Secondary | ICD-10-CM

## 2023-10-07 DIAGNOSIS — J309 Allergic rhinitis, unspecified: Secondary | ICD-10-CM | POA: Diagnosis not present

## 2023-10-07 DIAGNOSIS — F32A Depression, unspecified: Secondary | ICD-10-CM

## 2023-10-07 DIAGNOSIS — Z23 Encounter for immunization: Secondary | ICD-10-CM

## 2023-10-07 DIAGNOSIS — F419 Anxiety disorder, unspecified: Secondary | ICD-10-CM

## 2023-10-07 DIAGNOSIS — F331 Major depressive disorder, recurrent, moderate: Secondary | ICD-10-CM | POA: Diagnosis not present

## 2023-10-07 DIAGNOSIS — E039 Hypothyroidism, unspecified: Secondary | ICD-10-CM | POA: Diagnosis not present

## 2023-10-07 DIAGNOSIS — R739 Hyperglycemia, unspecified: Secondary | ICD-10-CM | POA: Insufficient documentation

## 2023-10-07 DIAGNOSIS — E559 Vitamin D deficiency, unspecified: Secondary | ICD-10-CM

## 2023-10-07 DIAGNOSIS — F411 Generalized anxiety disorder: Secondary | ICD-10-CM

## 2023-10-07 DIAGNOSIS — E781 Pure hyperglyceridemia: Secondary | ICD-10-CM

## 2023-10-07 MED ORDER — SERTRALINE HCL 100 MG PO TABS: ORAL_TABLET | ORAL | 1 refills | Status: DC

## 2023-10-07 NOTE — Assessment & Plan Note (Signed)
Lab Results  Component Value Date   HGBA1C 5.7 (H) 09/20/2023   HGBA1C 6.0 04/06/2023   HGBA1C 5.5 09/11/2019   Lab Results  Component Value Date   LDLCALC 92 09/20/2023   CREATININE 0.56 (L) 09/20/2023   A1C is coming down nicely with weight loss.

## 2023-10-07 NOTE — Patient Instructions (Signed)
VISIT SUMMARY:  You had a routine follow-up appointment today to review your ongoing health conditions, including hypothyroidism, vitamin D deficiency, prediabetes, and seasonal allergies. You have successfully lost 32 pounds since your last visit, which has positively impacted your overall well-being. Your energy levels have improved, and your prediabetic state has shown significant improvement. We also discussed your current medications and made some adjustments to your treatment plan.  YOUR PLAN:  -HYPOTHYROIDISM: Hypothyroidism is a condition where your thyroid gland does not produce enough thyroid hormone. You recently increased your Synthroid dosage from to , and you are feeling more energetic. We will repeat your thyroid function tests in 6 weeks to monitor your levels.  -VITAMIN D DEFICIENCY: Vitamin D deficiency means you do not have enough vitamin D in your body, which is important for bone health. You are currently taking 2000 IU of vitamin D daily, and your recent levels are within the normal range. Continue with your current regimen.  -PREDIABETES: Prediabetes is a condition where your blood sugar levels are higher than normal but not high enough to be classified as diabetes. Your recent weight loss of 30 pounds has improved your A1c from 6.0 to 5.7. Continue with your current lifestyle modifications, including diet and exercise.  -DEPRESSION/ANXIETY: You are managing your depression and anxiety with Sertraline, and you report good control of your symptoms. We will refill your Sertraline prescription.  -ALLERGIC RHINITIS: Allergic rhinitis is an allergic reaction that causes sneezing, congestion, and itchy eyes. Your symptoms are controlled with daily Claritin. Continue taking Claritin as needed.  -GENERAL HEALTH MAINTENANCE: We administered your influenza vaccine and the second dose of the Shingrix vaccine today. Please schedule a physical exam in 6  months.  INSTRUCTIONS:  Repeat thyroid function tests in 6 weeks. Schedule a physical exam in 6 months.

## 2023-10-07 NOTE — Assessment & Plan Note (Signed)
Vit D level normal, continue vit D 2000 international units daily.

## 2023-10-07 NOTE — Progress Notes (Signed)
Subjective:     Patient ID: Cassandra Barrera, female    DOB: 03-29-1971, 53 y.o.   MRN: [id number]  Chief Complaint  Patient presents with   Hypothyroidism    Here for follo wup   Anxiety    Doing well on zoloft    HPI  Discussed the use of AI scribe software for clinical note transcription with the patient, who gave verbal consent to proceed.  History of Present Illness   The patient, with a history of hypothyroidism, vitamin D deficiency, prediabetes, and seasonal allergies, presents for a routine follow-up. She has lost 32 pounds since the last visit and reports feeling better with the weight loss. She attributes the weight loss to dietary changes and increased physical activity, facilitated by improved mobility and energy levels. She believes her energy levels have improved due to better management of her hypothyroidism.  She has been working closely with a healthy weight and wellness clinic. She recently increased her Synthroid dosage from 100 to 125, which she started a week ago. She is also taking 2000 units of vitamin D daily. Her A1c has decreased from 6 to 5.7, indicating an improvement in her prediabetic state.  The patient continues to take sertraline for mood stabilization and reports good control of her anxiety. She also takes Claritin year-round for nasal allergies and occasional itchy hands. She has not yet received a flu shot for the current season.          Health Maintenance Due  Topic Date Due   INFLUENZA VACCINE  04/21/2023   COVID-19 Vaccine (3 - 2024-25 season) 05/22/2023   Zoster Vaccines- Shingrix (2 of 2) 06/01/2023    Past Medical History:  Diagnosis Date   Anemia    Anxiety    Depression    Edema    Hyperlipidemia    Hypothyroidism    OSA on CPAP    Vitamin D deficiency     Past Surgical History:  Procedure Laterality Date   COLONOSCOPY WITH PROPOFOL N/A 09/26/2023   Procedure: COLONOSCOPY WITH PROPOFOL;  Surgeon: Shellia Cleverly, DO;   Location: [facility];  Service: Gastroenterology;  Laterality: N/A;   POLYPECTOMY  09/26/2023   Procedure: POLYPECTOMY;  Surgeon: Shellia Cleverly, DO;  Location: [facility];  Service: Gastroenterology;;   WISDOM TOOTH EXTRACTION      Family History  Problem Relation Age of Onset   Hypertension Mother    Diabetes Mother    Heart disease Mother    Hyperlipidemia Mother    Kidney disease Mother    Anxiety disorder Mother    Depression Mother    Obesity Mother    Diabetes Father    Anxiety disorder Sister    Hypothyroidism Maternal Grandmother    Hypertension Maternal Grandmother    Heart disease Maternal Grandmother    Kidney failure Maternal Grandmother        solitary kidney   Bladder Cancer Maternal Grandfather        bladder- in his 69's   Diabetes Mellitus II Paternal Grandfather    CVA Paternal Grandfather    Anxiety disorder Daughter    Anxiety disorder Daughter     Social History   Socioeconomic History   Marital status: Married    Spouse name: Jose "Francis Dowse"   Number of children: 2   Years of education: college   Highest education level: Bachelor's degree (e.g., BA, AB, BS)  Occupational History   Occupation: part-time Designer, multimedia    Comment: High  [employer]  Tobacco Use   Smoking status: Never   Smokeless tobacco: Never  Vaping Use   Vaping status: Never Used  Substance and Sexual Activity   Alcohol use: No    Alcohol/week: 0.0 standard drinks of alcohol   Drug use: No   Sexual activity: Yes    Birth control/protection: I.U.D.  Other Topics Concern   Not on file  Social History Narrative   Lives with her husband    2 daughters- one grad college, one in college   Her sister and her husband's family live in [place]   Professional math tutor at [employer]   Completed bachelor's degree   Enjoys reading, Clinical cytogeneticist, travel      Social Drivers of Health   Financial Resource Strain: Low Risk  (10/06/2023)   Overall Financial Resource Strain  (CARDIA)    Difficulty of Paying Living Expenses: Not very hard  Food Insecurity: No Food Insecurity (10/06/2023)   Hunger Vital Sign    Worried About Running Out of Food in the Last Year: Never true    Ran Out of Food in the Last Year: Never true  Transportation Needs: No Transportation Needs (10/06/2023)   PRAPARE - Administrator, Civil Service (Medical): No    Lack of Transportation (Non-Medical): No  Physical Activity: Insufficiently Active (10/06/2023)   Exercise Vital Sign    Days of Exercise per Week: 2 days    Minutes of Exercise per Session: 20 min  Stress: No Stress Concern Present (10/06/2023)   [employer] of Occupational Health - Occupational Stress Questionnaire    Feeling of Stress : Not at all  Social Connections: Moderately Isolated (10/06/2023)   Social Connection and Isolation Panel [NHANES]    Frequency of Communication with Friends and Family: More than three times a week    Frequency of Social Gatherings with Friends and Family: Once a week    Attends Religious Services: Never    Database administrator or Organizations: No    Attends Engineer, structural: Not on file    Marital Status: Married  Catering manager Violence: Not on file    Outpatient Medications Prior to Visit  Medication Sig Dispense Refill   Cholecalciferol (VITAMIN D3) 50 MCG (2000 UT) capsule Take 1 capsule (2,000 Units total) by mouth daily.     levothyroxine (SYNTHROID) 125 MCG tablet Take 1 tablet (125 mcg total) by mouth daily. 90 tablet 3   loratadine (CLARITIN) 10 MG tablet Take 1 tablet (10 mg total) by mouth daily. 30 tablet 11   Na Sulfate-K Sulfate-Mg Sulf (SUPREP BOWEL PREP KIT) 17.5-3.13-1.6 GM/177ML SOLN Take 1 kit by mouth as directed. 324 mL 0   sertraline (ZOLOFT) 100 MG tablet TAKE 2 TABLETS BY MOUTH DAILY 180 tablet 3   No facility-administered medications prior to visit.    Allergies  Allergen Reactions   Sulfa Antibiotics Other (See Comments)     Reaction: lightheaded    ROS See HPI    Objective:    Physical Exam Constitutional:      General: She is not in acute distress.    Appearance: Normal appearance. She is well-developed.  HENT:     Head: Normocephalic and atraumatic.     Right Ear: External ear normal.     Left Ear: External ear normal.  Eyes:     General: No scleral icterus. Neck:     Thyroid: No thyromegaly.  Cardiovascular:     Rate and Rhythm: Normal  rate and regular rhythm.     Heart sounds: Normal heart sounds. No murmur heard. Pulmonary:     Effort: Pulmonary effort is normal. No respiratory distress.     Breath sounds: Normal breath sounds. No wheezing.  Musculoskeletal:     Cervical back: Neck supple.  Skin:    General: Skin is warm and dry.  Neurological:     Mental Status: She is alert and oriented to person, place, and time.  Psychiatric:        Mood and Affect: Mood normal.        Behavior: Behavior normal.        Thought Content: Thought content normal.        Judgment: Judgment normal.      BP (!) 107/57 (BP Location: Right Arm, Patient Position: Sitting, Cuff Size: Large)   Pulse 76   Temp 98.7 F (37.1 C) (Oral)   Resp 16   Ht 5\' 5"  (1.651 m)   Wt 278 lb (126.1 kg)   SpO2 97%   BMI 46.26 kg/m  Wt Readings from Last 3 Encounters:  10/07/23 278 lb (126.1 kg)  09/26/23 271 lb (122.9 kg)  09/20/23 271 lb (122.9 kg)       Assessment & Plan:   Problem List Items Addressed This Visit       Unprioritized   Vitamin D deficiency   Vit D level normal, continue vit D 2000 international units daily.       Morbid obesity with BMI of 45.0-49.9, adult (HCC)   Commended pt on her 30 pound weight loss. She continues to work with the healthy weight and wellness clinic and has been working on increasing her exercise.      Hypothyroidism - Primary   Lab Results  Component Value Date   TSH 9.730 (H) 09/20/2023   Recent increase in synthroid from to 125 mcg.  Repeat TSH in 6  weeks.      Hypertriglyceridemia   Lab Results  Component Value Date   CHOL 163 09/20/2023   HDL 33 (L) 09/20/2023   LDLCALC 92 09/20/2023   LDLDIRECT 110.0 04/06/2023   TRIG 224 (H) 09/20/2023   CHOLHDL 4.9 (H) 09/20/2023   Continue weight loss efforts, plan to repeat at physical next visit.       Hyperglycemia   Lab Results  Component Value Date   HGBA1C 5.7 (H) 09/20/2023   HGBA1C 6.0 04/06/2023   HGBA1C 5.5 09/11/2019   Lab Results  Component Value Date   LDLCALC 92 09/20/2023   CREATININE 0.56 (L) 09/20/2023   A1C is coming down nicely with weight loss.       Anxiety and depression   Stable on sertraline. Continue same.        Relevant Medications   sertraline (ZOLOFT) 100 MG tablet   Allergic rhinitis   Stable, on claritin.      Other Visit Diagnoses       Major depressive disorder, recurrent episode, moderate (HCC)       Relevant Medications   sertraline (ZOLOFT) 100 MG tablet     Generalized anxiety disorder       Relevant Medications   sertraline (ZOLOFT) 100 MG tablet       I have discontinued Zannie Kehr. Cudney's Na Sulfate-K Sulfate-Mg Sulfate concentrate. I am also having her maintain her loratadine, Vitamin D3, levothyroxine, and sertraline.  Meds ordered this encounter  Medications   sertraline (ZOLOFT) 100 MG tablet    Sig: TAKE  2 TABLETS BY MOUTH DAILY    Dispense:  180 tablet    Refill:  1    Supervising Provider:   Danise Edge A [[id number]]

## 2023-10-07 NOTE — Assessment & Plan Note (Signed)
Stable, on claritin.

## 2023-10-07 NOTE — Assessment & Plan Note (Signed)
Lab Results  Component Value Date   CHOL 163 09/20/2023   HDL 33 (L) 09/20/2023   LDLCALC 92 09/20/2023   LDLDIRECT 110.0 04/06/2023   TRIG 224 (H) 09/20/2023   CHOLHDL 4.9 (H) 09/20/2023   Continue weight loss efforts, plan to repeat at physical next visit.

## 2023-10-07 NOTE — Assessment & Plan Note (Signed)
Stable on sertraline Continue same.  

## 2023-10-07 NOTE — Assessment & Plan Note (Addendum)
Lab Results  Component Value Date   TSH 9.730 (H) 09/20/2023   Recent increase in synthroid from to 125 mcg.  Repeat TSH in 6 weeks.

## 2023-10-07 NOTE — Assessment & Plan Note (Signed)
Commended pt on her 30 pound weight loss. She continues to work with the healthy weight and wellness clinic and has been working on increasing her exercise.

## 2023-10-11 ENCOUNTER — Encounter (INDEPENDENT_AMBULATORY_CARE_PROVIDER_SITE_OTHER): Payer: Self-pay | Admitting: Adult Health

## 2023-10-11 ENCOUNTER — Ambulatory Visit (INDEPENDENT_AMBULATORY_CARE_PROVIDER_SITE_OTHER): Payer: 59 | Admitting: Adult Health

## 2023-10-11 VITALS — BP 126/72 | HR 61 | Temp 97.8°F | Ht 60.0 in | Wt 273.0 lb

## 2023-10-11 DIAGNOSIS — E559 Vitamin D deficiency, unspecified: Secondary | ICD-10-CM

## 2023-10-11 DIAGNOSIS — E781 Pure hyperglyceridemia: Secondary | ICD-10-CM | POA: Diagnosis not present

## 2023-10-11 DIAGNOSIS — Z6841 Body Mass Index (BMI) 40.0 and over, adult: Secondary | ICD-10-CM

## 2023-10-11 DIAGNOSIS — E039 Hypothyroidism, unspecified: Secondary | ICD-10-CM

## 2023-10-11 NOTE — Progress Notes (Signed)
WEIGHT SUMMARY AND BIOMETRICS  Vitals Temp: 97.8 F (36.6 C) BP: 126/72 Pulse Rate: 61 SpO2: 100 %   Anthropometric Measurements Height: 5' (1.524 m) Weight: 273 lb (123.8 kg) BMI (Calculated): 53.32 Weight at Last Visit: 271lb Weight Lost Since Last Visit: 0 Weight Gained Since Last Visit: 2lb Starting Weight: 296lb Total Weight Loss (lbs): 23 lb (10.4 kg) Peak Weight: 307lb   Body Composition  Body Fat %: 58.6 % Fat Mass (lbs): 160.4 lbs Muscle Mass (lbs): 107.4 lbs Visceral Fat Rating : 23   Other Clinical Data Fasting: no Labs: no Today's Visit #: 8 Starting Date: 06/15/23    Chief Complaint:   OBESITY Cassandra Barrera is here to discuss her progress with her obesity treatment plan. She is on the keeping a food journal and adhering to recommended goals of 1600-1700 calories and 120+ protein and states she is following her eating plan approximately 70-80 % of the time. She states she is exercising Walking 20 minutes 2 times per week.   Interim History:  The last week, she estimates to consume 80g protein/day She provided the following food recall that is typical of a day: Breakfast: Vilma Meckel [place], Sausage breakfast sandwich- 280 cal with 16 g protein Mid day snack: Protein Shake Dinner: Meat protein with grilled vegetables and small serving of CHO, ie Rice She works 1000-1500- so she prefers to have breakfast prior to work and a meal after work- with only a high protein shake during the day  Subjective:   1. Acquired hypothyroidism Discussed Labs PCP recently adjusted Levothyroxine from 100 mcg to 125 mcg daily She reports stable energy levels Denies palpitations, sweating, or increased anxiety   Latest Reference Range & Units 07/05/23 15:11 09/20/23 08:41  TSH 0.450 - 4.500 uIU/mL 12.99 (H) 9.730 (H)  (H): Data is abnormally high  2. Vitamin D deficiency Discussed Labs  Latest Reference Range & Units 09/20/23 08:41  Vitamin D, 25-Hydroxy 30.0  - 100.0 ng/mL 57.0   Level at goal She is on daily OTC Vit D 3 2,000 international units   3. Hypertriglyceridemia Discussed Labs Lipid Panel     Component Value Date/Time   CHOL 163 09/20/2023 0841   TRIG 224 (H) 09/20/2023 0841   HDL 33 (L) 09/20/2023 0841   CHOLHDL 4.9 (H) 09/20/2023 0841   CHOLHDL 4 04/06/2023 1129   VLDL 61.0 (H) 04/06/2023 1129   LDLCALC 92 09/20/2023 0841   LDLDIRECT 110.0 04/06/2023 1129   LABVLDL 38 09/20/2023 0841    She has been really mindful of reducing sugar/simple CHO She denies ETOH use Total- at goal Tgs- elevated HDL- below goal LDL- at goal She is not on statin therapy Per pt- she has family hx of HLD  Assessment/Plan:   1. Acquired hypothyroidism F/u with PCP as directed  2. Vitamin D deficiency (Primary) Continue daily OTC Vit D 3 2,000 international units   3. Hypertriglyceridemia Continue to reduce simple CHO/sugar intake Increase cardiovascular exercise  4. Morbid obesity (HCC), Current BMI 53.32  Cassandra Barrera is currently in the action stage of change. As such, her goal is to continue with weight loss efforts. She has agreed to keeping a food journal and adhering to recommended goals of 1600-1700 calories and 120+ protein.   Exercise goals: All adults should avoid inactivity. Some physical activity is better than none, and adults who participate in any amount of physical activity gain some health benefits. Adults should also include muscle-strengthening activities that involve all major muscle  groups on 2 or more days a week.  Behavioral modification strategies: increasing lean protein intake, decreasing simple carbohydrates, increasing vegetables, increasing water intake, no skipping meals, meal planning and cooking strategies, keeping healthy foods in the home, ways to avoid boredom eating, and planning for success.  Cassandra Barrera has agreed to follow-up with our clinic in 4 weeks. She was informed of the importance of frequent  follow-up visits to maximize her success with intensive lifestyle modifications for her multiple health conditions.   Objective:   Blood pressure 126/72, pulse 61, temperature 97.8 F (36.6 C), height 5' (1.524 m), weight 273 lb (123.8 kg), SpO2 100%. Body mass index is 53.32 kg/m.  General: Cooperative, alert, well developed, in no acute distress. HEENT: Conjunctivae and lids unremarkable. Cardiovascular: Regular rhythm.  Lungs: Normal work of breathing. Neurologic: No focal deficits.   Lab Results  Component Value Date   CREATININE 0.56 (L) 09/20/2023   BUN 19 09/20/2023   NA 140 09/20/2023   K 4.3 09/20/2023   CL 102 09/20/2023   CO2 21 09/20/2023   Lab Results  Component Value Date   ALT 22 09/20/2023   AST 15 09/20/2023   ALKPHOS 87 09/20/2023   BILITOT 0.4 09/20/2023   Lab Results  Component Value Date   HGBA1C 5.7 (H) 09/20/2023   HGBA1C 6.0 04/06/2023   HGBA1C 5.5 09/11/2019   Lab Results  Component Value Date   INSULIN 15.5 09/20/2023   INSULIN 14.1 06/15/2023   Lab Results  Component Value Date   TSH 9.730 (H) 09/20/2023   Lab Results  Component Value Date   CHOL 163 09/20/2023   HDL 33 (L) 09/20/2023   LDLCALC 92 09/20/2023   LDLDIRECT 110.0 04/06/2023   TRIG 224 (H) 09/20/2023   CHOLHDL 4.9 (H) 09/20/2023   Lab Results  Component Value Date   VD25OH 57.0 09/20/2023   VD25OH 24.30 (L) 04/06/2023   VD25OH 23.9 (L) 03/13/2019   Lab Results  Component Value Date   WBC 8.9 06/15/2023   HGB 13.7 06/15/2023   HCT 42.0 06/15/2023   MCV 91 06/15/2023   PLT 296 06/15/2023   No results found for: "IRON", "TIBC", "FERRITIN"  Attestation Statements:   Reviewed by clinician on day of visit: allergies, medications, problem list, medical history, surgical history, family history, social history, and previous encounter notes.  I have reviewed the above documentation for accuracy and completeness, and I agree with the above. -  Antino Mayabb d. Deno Sida,  NP-C

## 2023-11-02 NOTE — Patient Instructions (Signed)

## 2023-11-02 NOTE — Progress Notes (Addendum)
 PATIENT: Cassandra Barrera DOB: May 31, 1971  REASON FOR VISIT: follow up HISTORY FROM: patient  Virtual Visit via Mychart Video  I connected with Daymon Larsen on 11/08/23 at  8:45 AM EST via MyChart and verified that I am speaking with the correct person using two identifiers.   I discussed the limitations, risks, security and privacy concerns of performing an evaluation and management service by MyChart video and the availability of in person appointments. I also discussed with the patient that there may be a patient responsible charge related to this service. The patient expressed understanding and agreed to proceed.   History of Present Illness:  11/08/23 ALL (Mychart): Cassandra Barrera returns for follow up for OSA on CPAP. She continues to do well on therapy. She is using CPAP nightly for about 7 hours, on average. She denies concerns with machine or supplies.     11/03/2022 ALL (Mychart):  Cassandra Barrera is a 53 y.o. female here today for follow up for OSA on CPAP. She is doing well with her new machine. Set up 07/2022. She is using therapy nightly for about 7-8 hours. She denies concerns with machine or supplies.     06/29/22 ALL: Cassandra Barrera returns for follow up for OSA on CPAP. She continues to do well on therapy. She is using CPAP nightly for about 8 hours. She can not sleep without therapy. She denies concerns with CPAp machine or supplies. She was set up 09/2017 and wishes to get a new machine when eligible.   Compliance data dated 05/30/2022-06/28/2022 shows she used CPAP 30/30 days for greater than 4 hours, 100% compliance. Average usage was 7h 66m. Residual AHI was 2.7/h on 12cmH20. No significant leak noted.    Observations/Objective:  Generalized: Well developed, in no acute distress  Mentation: Alert oriented to time, place, history taking. Follows all commands speech and language fluent   Assessment and Plan:  53 y.o. year old female  has a past medical history  of Anemia, Anxiety, Depression, Edema, Hyperlipidemia, Hypothyroidism, OSA on CPAP, and Vitamin D deficiency. here with    ICD-10-CM   1. OSA on CPAP  G47.33 For home use only DME continuous positive airway pressure (CPAP)     [name] is doing well, today. Compliance report shows excellent daily and four hour compliance. She was encouraged to continue therapy nightly for at least 4 hours. She will follow up with me in 1 year.   Orders Placed This Encounter  Procedures   For home use only DME continuous positive airway pressure (CPAP)    Heated Humidity with all supplies as needed    Length of Need:   Lifetime    Patient has OSA or probable OSA:   Yes    Is the patient currently using CPAP in the home:   Yes    Settings:   Other see comments    CPAP supplies needed:   Mask, headgear, cushions, filters, heated tubing and water chamber    No orders of the defined types were placed in this encounter.    Follow Up Instructions:  I discussed the assessment and treatment plan with the patient. The patient was provided an opportunity to ask questions and all were answered. The patient agreed with the plan and demonstrated an understanding of the instructions.   The patient was advised to call back or seek an in-person evaluation if the symptoms worsen or if the condition fails to improve as anticipated.  I provided 15 minutes of face-to-face  time during this encounter. Patient located at their place of residence during Mychart video visit. Provider is in the office.    Shawnie Dapper, NP

## 2023-11-07 ENCOUNTER — Encounter: Payer: Self-pay | Admitting: *Deleted

## 2023-11-07 NOTE — Progress Notes (Unsigned)
 Marland Kitchen

## 2023-11-08 ENCOUNTER — Telehealth: Payer: BC Managed Care – PPO | Admitting: Family Medicine

## 2023-11-08 ENCOUNTER — Ambulatory Visit (INDEPENDENT_AMBULATORY_CARE_PROVIDER_SITE_OTHER): Payer: 59 | Admitting: Adult Health

## 2023-11-08 ENCOUNTER — Encounter: Payer: Self-pay | Admitting: Family Medicine

## 2023-11-08 ENCOUNTER — Encounter (INDEPENDENT_AMBULATORY_CARE_PROVIDER_SITE_OTHER): Payer: Self-pay | Admitting: Adult Health

## 2023-11-08 DIAGNOSIS — R739 Hyperglycemia, unspecified: Secondary | ICD-10-CM

## 2023-11-08 DIAGNOSIS — G4733 Obstructive sleep apnea (adult) (pediatric): Secondary | ICD-10-CM

## 2023-11-08 DIAGNOSIS — F419 Anxiety disorder, unspecified: Secondary | ICD-10-CM

## 2023-11-08 DIAGNOSIS — E781 Pure hyperglyceridemia: Secondary | ICD-10-CM

## 2023-11-08 DIAGNOSIS — F32A Depression, unspecified: Secondary | ICD-10-CM | POA: Diagnosis not present

## 2023-11-08 DIAGNOSIS — Z6841 Body Mass Index (BMI) 40.0 and over, adult: Secondary | ICD-10-CM

## 2023-11-08 NOTE — Progress Notes (Signed)
WEIGHT SUMMARY AND BIOMETRICS  Vitals Temp: 99.1 F (37.3 C) BP: 113/60 Pulse Rate: 83 SpO2: 97 %   Anthropometric Measurements Height: 5' (1.524 m) Weight: 264 lb (119.7 kg) BMI (Calculated): 51.56 Weight at Last Visit: 273 lb Weight Lost Since Last Visit: 9 lb Weight Gained Since Last Visit: 0 Starting Weight: 296 lb Total Weight Loss (lbs): 32 lb (14.5 kg) Peak Weight: 307 lb   Body Composition  Body Fat %: 56.5 % Fat Mass (lbs): 149.4 lbs Muscle Mass (lbs): 109.2 lbs Visceral Fat Rating : 21   Other Clinical Data Fasting: no Labs: no Today's Visit #: 9 Starting Date: 06/15/23    Chief Complaint:   OBESITY Cassandra Barrera is here to discuss her progress with her obesity treatment plan.  She is on the keeping a food journal and adhering to recommended goals of 1600-1700 calories and 120g+ protein and states she is following her eating plan approximately 90 % of the time.  She states she is exercising Walking 20-30 minutes 2 times per week.   Interim History:  Spring 2025 Health Goals: 1) Increase regular exercise  Ms. Icenhour has made a very concerted effort to increase daily protein intake to at least 120g.  Reviewed Bioim[employer] results: Muscle Mass: +1.8 lbs Adipose Mass: -11 lbs  Subjective:   1. Hypertriglyceridemia Lipid Panel     Component Value Date/Time   CHOL 163 09/20/2023 0841   TRIG 224 (H) 09/20/2023 0841   HDL 33 (L) 09/20/2023 0841   CHOLHDL 4.9 (H) 09/20/2023 0841   CHOLHDL 4 04/06/2023 1129   VLDL 61.0 (H) 04/06/2023 1129   LDLCALC 92 09/20/2023 0841   LDLDIRECT 110.0 04/06/2023 1129   LABVLDL 38 09/20/2023 0841  The 10-year ASCVD risk score (Arnett DK, et al., 2019) is: 1.5%   Values used to calculate the score:     Age: 53 years     Sex: Female     Is Non-Hispanic African American: No     Diabetic: No     Tobacco smoker: No     Systolic Blood Pressure: 113 mmHg     Is BP treated: No     HDL Cholesterol: 33 mg/dL      Total Cholesterol: 163 mg/dL   She is not currently on statin therapy  2. Hyperglycemia Per pt- both parents have T2D. Father dx'd in early 57s Mother dx'd in early 82s  Latest Reference Range & Units 09/11/19 15:56 07/01/20 12:06 04/06/23 11:29 09/20/23 08:41  Glucose 70 - 99 mg/dL 96 99 84 90    Latest Reference Range & Units 06/15/23 09:39 09/20/23 08:41  INSULIN 2.6 - 24.9 uIU/mL 14.1 15.5   She os not currently on antidiabetic medications  3. Anxiety and depression She endorse stable mood, denies SI/HI PCP manges daily Sertraline 100mg    Assessment/Plan:   1. Hypertriglyceridemia Continue to limit sat fat, sugar/simple CHO intake Increase regular cardiovascular exercise  2. Hyperglycemia Limit sugar/simple CHO intake  3. Anxiety and depression Increase cardiovascular exercise- strive to walk at least 3 x week for 20-30 mins, moderate pace  4. Morbid obesity (HCC), Current BMI   Cassandra Barrera is currently in the action stage of change. As such, her goal is to continue with weight loss efforts. She has agreed to keeping a food journal and adhering to recommended goals of 1600-1700 calories and 120g+ protein.   Exercise goals: All adults should avoid inactivity. Some physical activity is better than none, and adults who participate in  any amount of physical activity gain some health benefits. Adults should also include muscle-strengthening activities that involve all major muscle groups on 2 or more days a week.  Behavioral modification strategies: increasing lean protein intake, decreasing simple carbohydrates, increasing vegetables, increasing water intake, no skipping meals, meal planning and cooking strategies, keeping healthy foods in the home, ways to avoid boredom eating, and planning for success.  Cassandra Barrera has agreed to follow-up with our clinic in 4 weeks. She was informed of the importance of frequent follow-up visits to maximize her success with intensive lifestyle  modifications for her multiple health conditions.   Objective:   Blood pressure 113/60, pulse 83, temperature 99.1 F (37.3 C), height 5' (1.524 m), weight 264 lb (119.7 kg), SpO2 97%. Body mass index is 51.56 kg/m.  General: Cooperative, alert, well developed, in no acute distress. HEENT: Conjunctivae and lids unremarkable. Cardiovascular: Regular rhythm.  Lungs: Normal work of breathing. Neurologic: No focal deficits.   Lab Results  Component Value Date   CREATININE 0.56 (L) 09/20/2023   BUN 19 09/20/2023   NA 140 09/20/2023   K 4.3 09/20/2023   CL 102 09/20/2023   CO2 21 09/20/2023   Lab Results  Component Value Date   ALT 22 09/20/2023   AST 15 09/20/2023   ALKPHOS 87 09/20/2023   BILITOT 0.4 09/20/2023   Lab Results  Component Value Date   HGBA1C 5.7 (H) 09/20/2023   HGBA1C 6.0 04/06/2023   HGBA1C 5.5 09/11/2019   Lab Results  Component Value Date   INSULIN 15.5 09/20/2023   INSULIN 14.1 06/15/2023   Lab Results  Component Value Date   TSH 9.730 (H) 09/20/2023   Lab Results  Component Value Date   CHOL 163 09/20/2023   HDL 33 (L) 09/20/2023   LDLCALC 92 09/20/2023   LDLDIRECT 110.0 04/06/2023   TRIG 224 (H) 09/20/2023   CHOLHDL 4.9 (H) 09/20/2023   Lab Results  Component Value Date   VD25OH 57.0 09/20/2023   VD25OH 24.30 (L) 04/06/2023   VD25OH 23.9 (L) 03/13/2019   Lab Results  Component Value Date   WBC 8.9 06/15/2023   HGB 13.7 06/15/2023   HCT 42.0 06/15/2023   MCV 91 06/15/2023   PLT 296 06/15/2023   No results found for: "IRON", "TIBC", "FERRITIN"   Attestation Statements:   Reviewed by clinician on day of visit: allergies, medications, problem list, medical history, surgical history, family history, social history, and previous encounter notes.  Time spent on visit including pre-visit chart review and post-visit care and charting was 28 minutes.   I have reviewed the above documentation for accuracy and completeness, and I  agree with the above. -  Artha Chiasson d. Alexys Gassett, NP-C

## 2023-11-10 NOTE — Progress Notes (Signed)
 Community message sent to [employer] that CPAP supplies order placed.

## 2023-11-14 ENCOUNTER — Other Ambulatory Visit (INDEPENDENT_AMBULATORY_CARE_PROVIDER_SITE_OTHER): Payer: 59

## 2023-11-14 DIAGNOSIS — E039 Hypothyroidism, unspecified: Secondary | ICD-10-CM | POA: Diagnosis not present

## 2023-11-14 LAB — TSH: TSH: 8.56 u[IU]/mL — ABNORMAL HIGH (ref 0.35–5.50)

## 2023-11-15 ENCOUNTER — Telehealth: Payer: Self-pay | Admitting: Family

## 2023-11-15 DIAGNOSIS — E039 Hypothyroidism, unspecified: Secondary | ICD-10-CM

## 2023-11-15 MED ORDER — LEVOTHYROXINE SODIUM 150 MCG PO TABS: 150.0000 ug | ORAL_TABLET | Freq: Every day | ORAL | 0 refills | Status: DC

## 2023-11-15 NOTE — Telephone Encounter (Signed)
 Thyroid testing is improving but shows that we still need to further increase her synthroid. I will send rx for 150 mcg once daily. Repeat TSH in 6 weeks.

## 2023-11-15 NOTE — Telephone Encounter (Signed)
 Patient notified of results, medication dose change and scheduled for tsh in 6 weeks.

## 2023-12-08 ENCOUNTER — Encounter (INDEPENDENT_AMBULATORY_CARE_PROVIDER_SITE_OTHER): Payer: Self-pay

## 2023-12-08 ENCOUNTER — Ambulatory Visit (INDEPENDENT_AMBULATORY_CARE_PROVIDER_SITE_OTHER): Payer: 59 | Admitting: Adult Health

## 2023-12-13 ENCOUNTER — Ambulatory Visit (INDEPENDENT_AMBULATORY_CARE_PROVIDER_SITE_OTHER): Admitting: Internal Medicine

## 2023-12-13 ENCOUNTER — Encounter (INDEPENDENT_AMBULATORY_CARE_PROVIDER_SITE_OTHER): Payer: Self-pay | Admitting: Internal Medicine

## 2023-12-13 VITALS — BP 92/53 | HR 70 | Temp 98.2°F | Ht 60.0 in | Wt 259.0 lb

## 2023-12-13 DIAGNOSIS — E66813 Obesity, class 3: Secondary | ICD-10-CM | POA: Diagnosis not present

## 2023-12-13 DIAGNOSIS — G4733 Obstructive sleep apnea (adult) (pediatric): Secondary | ICD-10-CM | POA: Diagnosis not present

## 2023-12-13 DIAGNOSIS — Z6841 Body Mass Index (BMI) 40.0 and over, adult: Secondary | ICD-10-CM

## 2023-12-13 DIAGNOSIS — E781 Pure hyperglyceridemia: Secondary | ICD-10-CM | POA: Diagnosis not present

## 2023-12-13 DIAGNOSIS — R7303 Prediabetes: Secondary | ICD-10-CM | POA: Insufficient documentation

## 2023-12-13 MED ORDER — MULTI-VITAMIN/MINERALS PO TABS: 1.0000 | ORAL_TABLET | Freq: Every day | ORAL | Status: AC

## 2023-12-13 NOTE — Progress Notes (Signed)
 Office: [phone]  /  Fax: [phone]  Weight Summary And Biometrics  Vitals Temp: 98.2 F (36.8 C) BP: (!) 92/53 Pulse Rate: 70 SpO2: 95 %   Anthropometric Measurements Height: 5' (1.524 m) Weight: 259 lb (117.5 kg) BMI (Calculated): 50.58 Weight at Last Visit: 264 lb Weight Lost Since Last Visit: 5 lb Weight Gained Since Last Visit: 0 Starting Weight: 296 lb Total Weight Loss (lbs): 37 lb (16.8 kg) Peak Weight: 307 lb   Body Composition  Body Fat %: 54.7 % Fat Mass (lbs): 141.8 lbs Muscle Mass (lbs): 111.6 lbs Total Body Water (lbs): 90 lbs Visceral Fat Rating : 20    No data recorded Today's Visit #: 10  Starting Date: 06/15/23   Subjective   Chief Complaint: Obesity  Interval History Discussed the use of AI scribe software for clinical note transcription with the patient, who gave verbal consent to proceed.  History of Present Illness Cassandra Barrera is a 53 year old female with obesity who presents for medical weight management.  She has been participating in a medically supervised weight management program since September of the previous year. Her initial weight was 308 pounds, and she has lost 49 pounds, approximately 16% of her total body weight. She has lost 5 pounds since her last office visit. She follows a calorie-restricted diet, consuming 1500 to 1700 calories daily, and tracks her intake using the My Fitness Pal app. She consumes approximately 120 grams of protein daily. Her metabolic rate was measured at 6578 calories per day conducted in the fall of the previous year.  She has a history of prediabetes with a previous A1c of 5.7% and elevated triglycerides. Both of her parents have diabetes. She is mindful of her carbohydrate intake to manage her insulin levels. She is due for disease monitoring labs in June.  She has sleep apnea and uses a CPAP machine. She gets about seven to nine hours of sleep per night. No high levels of stress are  reported.  She engages in physical activity four to five days a week, primarily walking for about 20 to 30 minutes per session. She reports increased energy levels, which she attributes to weight loss and adjustments in her thyroid medication. She is currently on levothyroxine 115 mcg, taken on an empty stomach each morning.  She drinks at least 64 ounces of water daily and aims to increase this to 90 ounces. She is not currently taking a multivitamin.    Challenges affecting patient progress: none.    Pharmacotherapy for weight management: She is currently taking no anti-obesity medication.   Assessment and Plan   Treatment Plan For Obesity:  Recommended Dietary Goals  Mayanna is currently in the action stage of change. As such, her goal is to continue weight management plan. She has agreed to: keep a food journal with a target of  1500 calories per day and 90-120 grams of protein per day or 30-40 grams per meal.  Behavioral Health and Counseling  We discussed the following behavioral modification strategies today: continue to work on maintaining a reduced calorie state, getting the recommended amount of protein, incorporating whole foods, making healthy choices, staying well hydrated and practicing mindfulness when eating..  Additional education and resources provided today: None  Recommended Physical Activity Goals  Kiarah has been advised to work up to 150 minutes of moderate intensity aerobic activity a week and strengthening exercises 2-3 times per week for cardiovascular health, weight loss maintenance and preservation of muscle mass.  She has agreed to :  Start strengthening exercises with a goal of 2-3 sessions a week   Pharmacotherapy  We discussed various medication options to help Sheyenne with her weight loss efforts and we both agreed to : continue with nutritional and behavioral strategies  Associated Conditions Impacted by Obesity Treatment  Obstructive  sleep apnea on CPAP  Prediabetes  Hypertriglyceridemia  Class 3 severe obesity with serious comorbidity and body mass index (BMI) of 50.0 to 59.9 in adult, unspecified obesity type (HCC)  Other orders -     Multi-Vitamin/Minerals; Take 1 tablet by mouth daily.    Assessment and Plan Assessment & Plan Obesity She has been in a medically supervised weight management program since September 2024, achieving a 16% weight loss from a peak of 308 pounds in October 2023. Her current weight loss rate is 1-2 pounds per week, supported by a calorie intake of 1500-1700 calories per day with 120 grams of protein, and exercise. Her metabolic rate is slightly faster than calculated, aiding her weight loss. Body composition has improved with a reduction in body fat percentage from 58% to 54% and visceral fat from 23 to 20, while maintaining muscle mass. Her progress is commendable . - Continue current weight management program with calorie intake of 1500-1700 calories per day and 120 grams of protein. - Encourage continuation of exercise regimen, aiming for 150 minutes of cardio per week. - Incorporate strengthening exercises 2-3 times per week to boost metabolism and preserve muscle mass. - Increase water intake to 90 ounces per day. - Start a multivitamin with minerals.  Prediabetes She has prediabetes with an A1c of 5.7% and a family history of diabetes. Weight loss and dietary changes are part of the management plan to prevent progression to diabetes. The carbohydrate-insulin model of obesity was discussed, emphasizing limiting carbohydrate intake to manage insulin levels and support weight loss. - Order repeat A1c in June to monitor prediabetes status.  Hypertriglyceridemia She has elevated triglycerides managed through weight loss and dietary changes. The relationship between insulin resistance, prediabetes, and elevated triglycerides was discussed. - Order lipid panel in June to monitor  triglyceride levels.  Sleep Apnea She has sleep apnea and is currently using CPAP therapy.  General Health Maintenance General health maintenance was discussed in the context of weight management and dietary habits, emphasizing hydration and vitamin supplementation. - Encourage intake of fruits and vegetables for hydration and nutrients. - Ensure urine is clear and lips are not chapped as indicators of adequate hydration.  Follow-up Follow-up plans were discussed to monitor her progress and adjust her management plan as needed. - Schedule follow-up appointment in 4 weeks. - Order blood work in June to check triglycerides and repeat A1c.      Objective   Physical Exam:  Blood pressure (!) 92/53, pulse 70, temperature 98.2 F (36.8 C), height 5' (1.524 m), weight 259 lb (117.5 kg), SpO2 95%. Body mass index is 50.58 kg/m.  General: She is overweight, cooperative, alert, well developed, and in no acute distress. PSYCH: Has normal mood, affect and thought process.   HEENT: EOMI, sclerae are anicteric. Lungs: Normal breathing effort, no conversational dyspnea. Extremities: No edema.  Neurologic: No gross sensory or motor deficits. No tremors or fasciculations noted.    Diagnostic Data Reviewed:  BMET    Component Value Date/Time   NA 140 09/20/2023 0841   K 4.3 09/20/2023 0841   CL 102 09/20/2023 0841   CO2 21 09/20/2023 0841   GLUCOSE 90 09/20/2023  0841   GLUCOSE 84 04/06/2023 1129   BUN 19 09/20/2023 0841   CREATININE 0.56 (L) 09/20/2023 0841   CREATININE 0.53 03/06/2013 1301   CALCIUM 9.3 09/20/2023 0841   GFRNONAA 108 07/01/2020 1206   GFRAA 125 07/01/2020 1206   Lab Results  Component Value Date   HGBA1C 5.7 (H) 09/20/2023   HGBA1C 5.5 09/11/2019   Lab Results  Component Value Date   INSULIN 15.5 09/20/2023   INSULIN 14.1 06/15/2023   Lab Results  Component Value Date   TSH 8.56 (H) 11/14/2023   CBC    Component Value Date/Time   WBC 8.9 06/15/2023  0939   WBC 10.6 (H) 03/06/2013 1301   RBC 4.63 06/15/2023 0939   RBC 4.74 03/06/2013 1301   HGB 13.7 06/15/2023 0939   HCT 42.0 06/15/2023 0939   PLT 296 06/15/2023 0939   MCV 91 06/15/2023 0939   MCH 29.6 06/15/2023 0939   MCH 29.5 03/06/2013 1301   MCHC 32.6 06/15/2023 0939   MCHC 33.7 03/06/2013 1301   RDW 13.3 06/15/2023 0939   Iron Studies No results found for: "IRON", "TIBC", "FERRITIN", "IRONPCTSAT" Lipid Panel     Component Value Date/Time   CHOL 163 09/20/2023 0841   TRIG 224 (H) 09/20/2023 0841   HDL 33 (L) 09/20/2023 0841   CHOLHDL 4.9 (H) 09/20/2023 0841   CHOLHDL 4 04/06/2023 1129   VLDL 61.0 (H) 04/06/2023 1129   LDLCALC 92 09/20/2023 0841   LDLDIRECT 110.0 04/06/2023 1129   Hepatic Function Panel     Component Value Date/Time   PROT 7.2 09/20/2023 0841   ALBUMIN 4.6 09/20/2023 0841   AST 15 09/20/2023 0841   ALT 22 09/20/2023 0841   ALKPHOS 87 09/20/2023 0841   BILITOT 0.4 09/20/2023 0841      Component Value Date/Time   TSH 8.56 (H) 11/14/2023 0855   Nutritional Lab Results  Component Value Date   VD25OH 57.0 09/20/2023   VD25OH 24.30 (L) 04/06/2023   VD25OH 23.9 (L) 03/13/2019    Medications: Outpatient Encounter Medications as of 12/13/2023  Medication Sig   Cholecalciferol (VITAMIN D3) 50 MCG (2000 UT) capsule Take 1 capsule (2,000 Units total) by mouth daily.   levothyroxine (SYNTHROID) 150 MCG tablet Take 1 tablet (150 mcg total) by mouth daily.   loratadine (CLARITIN) 10 MG tablet Take 1 tablet (10 mg total) by mouth daily.   Multiple Vitamins-Minerals (MULTIVITAMIN WITH MINERALS) tablet Take 1 tablet by mouth daily.   sertraline (ZOLOFT) 100 MG tablet TAKE 2 TABLETS BY MOUTH DAILY   No facility-administered encounter medications on file as of 12/13/2023.     Follow-Up   Return in about 4 weeks (around 01/10/2024) for with [facility].. She was informed of the importance of frequent follow up visits to maximize her success with  intensive lifestyle modifications for her multiple health conditions.  Attestation Statement   Reviewed by clinician on day of visit: allergies, medications, problem list, medical history, surgical history, family history, social history, and previous encounter notes.     Worthy Rancher, MD

## 2023-12-28 ENCOUNTER — Other Ambulatory Visit (INDEPENDENT_AMBULATORY_CARE_PROVIDER_SITE_OTHER): Payer: 59

## 2023-12-28 ENCOUNTER — Telehealth: Payer: BC Managed Care – PPO | Admitting: Adult Health

## 2023-12-28 DIAGNOSIS — E039 Hypothyroidism, unspecified: Secondary | ICD-10-CM | POA: Diagnosis not present

## 2023-12-29 ENCOUNTER — Encounter: Payer: Self-pay | Admitting: Family

## 2023-12-29 LAB — TSH: TSH: 2.7 u[IU]/mL (ref 0.35–5.50)

## 2024-01-18 ENCOUNTER — Ambulatory Visit (INDEPENDENT_AMBULATORY_CARE_PROVIDER_SITE_OTHER): Admitting: Internal Medicine

## 2024-01-23 ENCOUNTER — Encounter (INDEPENDENT_AMBULATORY_CARE_PROVIDER_SITE_OTHER): Payer: Self-pay | Admitting: Family Medicine

## 2024-01-23 ENCOUNTER — Ambulatory Visit (INDEPENDENT_AMBULATORY_CARE_PROVIDER_SITE_OTHER): Admitting: Family Medicine

## 2024-01-23 VITALS — BP 123/76 | HR 82 | Temp 98.7°F | Ht 60.0 in | Wt 256.0 lb

## 2024-01-23 DIAGNOSIS — E559 Vitamin D deficiency, unspecified: Secondary | ICD-10-CM | POA: Diagnosis not present

## 2024-01-23 DIAGNOSIS — F39 Unspecified mood [affective] disorder: Secondary | ICD-10-CM

## 2024-01-23 DIAGNOSIS — E039 Hypothyroidism, unspecified: Secondary | ICD-10-CM

## 2024-01-23 DIAGNOSIS — Z6841 Body Mass Index (BMI) 40.0 and over, adult: Secondary | ICD-10-CM

## 2024-01-23 NOTE — Progress Notes (Signed)
 Cassandra Barrera, D.O.  ABFM, ABOM Specializing in Clinical Bariatric Medicine  Office located at: [address] [place], [place]  [address]   Assessment and Plan:  No orders of the defined types were placed in this encounter.  There are no discontinued medications.   No orders of the defined types were placed in this encounter.   Will obtain fasting labs during next OV. FOR THE DISEASE OF OBESITY:  Morbid obesity (HCC), Current BMI 50.00 Morbid obesity with BMI of 50.0-59.9, adult (HCC) - starting BMI 56.83 06/15/23 Assessment & Plan: Since last office visit on 12/13/2023, patient's muscle mass has increased by 8.2 lbs. Fat mass has decreased by 38.4 lbs. Total body water has increased by 4.6 lbs.  Counseling done on how various foods will affect these numbers and how to maximize success  Total lbs lost to date: 40 lbs Total weight loss percentage to date: 13.51%    Recommended Dietary Goals Cassandra Barrera is currently in the action stage of change. As such, her goal is to continue weight management plan.  She has agreed to: continue current plan   Behavioral Intervention We discussed the following today: continue to work on maintaining a reduced calorie state, getting the recommended amount of protein, incorporating whole foods, making healthy choices, staying well hydrated and practicing mindfulness when eating.  Additional resources provided today: None  Evidence-based interventions for health behavior change were utilized today including the discussion of self monitoring techniques, problem-solving barriers and SMART goal setting techniques.   Regarding patient's less desirable eating habits and patterns, we employed the technique of small changes.   Pt will specifically work on: n/a   Recommended Physical Activity Goals Cassandra Barrera has been advised to work up to 300-450 minutes of moderate intensity aerobic activity a week and strengthening exercises 2-3 times per  week for cardiovascular health, weight loss maintenance and preservation of muscle mass.   She has agreed to :  Continue current level of physical activity    Pharmacotherapy We both agreed to : continue with nutritional and behavioral strategies   ASSOCIATED CONDITIONS ADDRESSED TODAY:  Acquired hypothyroidism Assessment & Plan: Lab Results  Component Value Date   TSH 2.70 12/28/2023   FREET4 0.92 09/20/2023  Cassandra Barrera's TSH levels were elevated when we last obtained results at 8.56. PCP obtained results on 12/28/2023, and TSH greatly decreased to 2.70. Informed pt about risks of thyroid  affecting weight loss, including slower metabolism. Educated on importance of a balanced diet and exercise for managing condition. Will continue to monitor alongside PCP.   Mood disorder [facility]) Assessment & Plan: Cassandra Barrera is taking Zoloft  100 mg two tablets daily. Mood currently stable, pt denies any SI/HI. She endorses mood has greatly improved. Reminded patient of the importance of following their prudent nutrition plan and how food can affect mood as well to support emotional wellbeing. Continue current stress management techniques and medication regimen. Will monitor condition alongside PCP.   Vitamin D  deficiency Assessment & Plan: Cassandra Barrera is taking Cholecalciferol 2000 units daily. She is tolerating this supplement well, no adverse SE. Continue current regimen. Reminded of benefits of optimal Vit D levels for mood, energy, and overall wellbeing. Will recheck levels 3 months from last obtained results.   Follow up:   Return in about 6 weeks (around 03/07/2024) for fasting labs: no caffeine, no exercise, and no sex. She was informed of the importance of frequent follow up visits to maximize her success with intensive lifestyle modifications for her multiple health  conditions.  Subjective:   Chief complaint: Obesity Cassandra Barrera is here to discuss her progress with her obesity treatment plan.  She is keeping a food journal and adhering to recommended goals of 1600-1700 calories and 120g+ protein and states she is following her eating plan approximately 75% of the time. She states she is walking 20 minutes 6 days per week and lifting weights 90 minutes 3 days per week.  Interval History:  Cassandra Barrera is here for a follow up office visit. Since last OV on 12/13/2023, pt is down 3 lbs. She recently started weight lifting about one week ago. Additionally, Cassandra Barrera is doing well with her MP and denies any struggles with getting in all of her foods.   Pharmacotherapy for weight loss: She is currently taking no anti-obesity medication.   Review of Systems:  Pertinent positives were addressed with patient today.  Reviewed by clinician on day of visit: allergies, medications, problem list, medical history, surgical history, family history, social history, and previous encounter notes.  Weight Summary and Biometrics   Weight Lost Since Last Visit: 3lb  Weight Gained Since Last Visit: 0lb    Vitals Temp: 98.7 F (37.1 C) BP: 123/76 Pulse Rate: 82 SpO2: 97 %   Anthropometric Measurements Height: 5' (1.524 m) Weight: 256 lb (116.1 kg) BMI (Calculated): 50 Weight at Last Visit: 264lb Weight Lost Since Last Visit: 3lb Weight Gained Since Last Visit: 0lb Starting Weight: 296lb Total Weight Loss (lbs): 40 lb (18.1 kg) Peak Weight: 307lb   Body Composition  Body Fat %: 50.8 % Fat Mass (lbs): 103.4 lbs Muscle Mass (lbs): 119.8 lbs Total Body Water (lbs): 94.6 lbs Visceral Fat Rating : 19   Other Clinical Data Fasting: No Labs: No Today's Visit #: 11 Starting Date: 06/15/23    Objective:   PHYSICAL EXAM: Blood pressure 123/76, pulse 82, temperature 98.7 F (37.1 C), height 5' (1.524 m), weight 256 lb (116.1 kg), SpO2 97%. Body mass index is 50 kg/m.  General: she is overweight, cooperative and in no acute distress. PSYCH: Has normal mood, affect and  thought process.   HEENT: EOMI, sclerae are anicteric. Lungs: Normal breathing effort, no conversational dyspnea. Extremities: Moves * 4 Neurologic: A and O * 3, good insight  DIAGNOSTIC DATA REVIEWED: BMET    Component Value Date/Time   NA 140 09/20/2023 0841   K 4.3 09/20/2023 0841   CL 102 09/20/2023 0841   CO2 21 09/20/2023 0841   GLUCOSE 90 09/20/2023 0841   GLUCOSE 84 04/06/2023 1129   BUN 19 09/20/2023 0841   CREATININE 0.56 (L) 09/20/2023 0841   CREATININE 0.53 03/06/2013 1301   CALCIUM 9.3 09/20/2023 0841   GFRNONAA 108 07/01/2020 1206   GFRAA 125 07/01/2020 1206   Lab Results  Component Value Date   HGBA1C 5.7 (H) 09/20/2023   HGBA1C 5.5 09/11/2019   Lab Results  Component Value Date   INSULIN  15.5 09/20/2023   INSULIN  14.1 06/15/2023   Lab Results  Component Value Date   TSH 2.70 12/28/2023   CBC    Component Value Date/Time   WBC 8.9 06/15/2023 0939   WBC 10.6 (H) 03/06/2013 1301   RBC 4.63 06/15/2023 0939   RBC 4.74 03/06/2013 1301   HGB 13.7 06/15/2023 0939   HCT 42.0 06/15/2023 0939   PLT 296 06/15/2023 0939   MCV 91 06/15/2023 0939   MCH 29.6 06/15/2023 0939   MCH 29.5 03/06/2013 1301   MCHC 32.6 06/15/2023 0939   MCHC 33.7  03/06/2013 1301   RDW 13.3 06/15/2023 0939   Iron Studies No results found for: "IRON", "TIBC", "FERRITIN", "IRONPCTSAT" Lipid Panel     Component Value Date/Time   CHOL 163 09/20/2023 0841   TRIG 224 (H) 09/20/2023 0841   HDL 33 (L) 09/20/2023 0841   CHOLHDL 4.9 (H) 09/20/2023 0841   CHOLHDL 4 04/06/2023 1129   VLDL 61.0 (H) 04/06/2023 1129   LDLCALC 92 09/20/2023 0841   LDLDIRECT 110.0 04/06/2023 1129   Hepatic Function Panel     Component Value Date/Time   PROT 7.2 09/20/2023 0841   ALBUMIN 4.6 09/20/2023 0841   AST 15 09/20/2023 0841   ALT 22 09/20/2023 0841   ALKPHOS 87 09/20/2023 0841   BILITOT 0.4 09/20/2023 0841      Component Value Date/Time   TSH 2.70 12/28/2023 1534   Nutritional Lab  Results  Component Value Date   VD25OH 57.0 09/20/2023   VD25OH 24.30 (L) 04/06/2023   VD25OH 23.9 (L) 03/13/2019    Attestations:   I, Camryn Mix, acting as a Stage manager for [employer], DO., have compiled all relevant documentation for today's office visit on behalf of Cassandra Sensor, DO, while in the presence of [employer], DO.  I have reviewed the above documentation for accuracy and completeness, and I agree with the above. Cassandra Barrera, D.O.  The 21st Century Cures Act was signed into law in 2016 which includes the topic of electronic health records.  This provides immediate access to information in MyChart.  This includes consultation notes, operative notes, office notes, lab results and pathology reports.  If you have any questions about what you read please let us  know at your next visit so we can discuss your concerns and take corrective action if need be.  We are right here with you.

## 2024-02-12 ENCOUNTER — Other Ambulatory Visit: Payer: Self-pay | Admitting: Family

## 2024-03-07 ENCOUNTER — Ambulatory Visit (INDEPENDENT_AMBULATORY_CARE_PROVIDER_SITE_OTHER): Admitting: Family Medicine

## 2024-03-07 ENCOUNTER — Encounter (INDEPENDENT_AMBULATORY_CARE_PROVIDER_SITE_OTHER): Payer: Self-pay

## 2024-03-29 ENCOUNTER — Encounter (INDEPENDENT_AMBULATORY_CARE_PROVIDER_SITE_OTHER): Payer: Self-pay | Admitting: Family Medicine

## 2024-03-29 ENCOUNTER — Ambulatory Visit (INDEPENDENT_AMBULATORY_CARE_PROVIDER_SITE_OTHER): Admitting: Family Medicine

## 2024-03-29 VITALS — BP 94/64 | HR 64 | Temp 98.3°F | Ht 60.0 in | Wt 256.0 lb

## 2024-03-29 DIAGNOSIS — Z6841 Body Mass Index (BMI) 40.0 and over, adult: Secondary | ICD-10-CM

## 2024-03-29 DIAGNOSIS — R7303 Prediabetes: Secondary | ICD-10-CM

## 2024-03-29 DIAGNOSIS — E782 Mixed hyperlipidemia: Secondary | ICD-10-CM | POA: Diagnosis not present

## 2024-03-29 DIAGNOSIS — E039 Hypothyroidism, unspecified: Secondary | ICD-10-CM | POA: Diagnosis not present

## 2024-03-29 NOTE — Progress Notes (Signed)
 SUBJECTIVE:  Chief Complaint: Obesity  Interim History: Patient last appointment early May.  She hasn't been as adherent to the meal plan as she wanted due to vacation.  She started exercising consistently with her husband starting in May.  She is doing about an hour of weight training and 30 minutes on the treadmill.  Overall she does feel food journaling to be easy to do.  Patient is planning a trip to the beach in a few weeks to [facility].  Cassandra Barrera is here to discuss her progress with her obesity treatment plan. She is on the keeping a food journal and adhering to recommended goals of 1600-1700 calories and 120+ grams of protein and states she is following her eating plan approximately 60 % of the time. She states she is exercising 90 minutes 5 times per week.   OBJECTIVE: Visit Diagnoses: Problem List Items Addressed This Visit       Endocrine   Hypothyroidism - Primary   Relevant Orders   TSH     Other   Prediabetes   Relevant Orders   Comprehensive metabolic panel with GFR   Hemoglobin A1c   Insulin , random   Other Visit Diagnoses       Mixed hyperlipidemia       Relevant Orders   Lipid Panel With LDL/HDL Ratio     Morbid obesity with BMI of 50.0-59.9, adult (HCC) - starting BMI 56.83 06/15/23         BMI 50.0-59.9, adult (HCC)           Vitals Temp: 98.3 F (36.8 C) BP: 94/64 Pulse Rate: 64 SpO2: 99 %   Anthropometric Measurements Height: 5' (1.524 m) Weight: 256 lb (116.1 kg) BMI (Calculated): 50 Weight at Last Visit: 256 lb Weight Lost Since Last Visit: 0 Weight Gained Since Last Visit: 0 Starting Weight: 296 lb Total Weight Loss (lbs): 40 lb (18.1 kg)   Body Composition  Body Fat %: 50 % Fat Mass (lbs): 128.2 lbs Muscle Mass (lbs): 121.8 lbs Total Body Water (lbs): 92 lbs Visceral Fat Rating : 18   Other Clinical Data Today's Visit #: 12 Starting Date: 06/15/23 Comments: 1600-1700/120+     ASSESSMENT AND PLAN: Assessment &  Plan Acquired hypothyroidism Previously elevated TSH in February of this year.  Repeat TSH in April normal.  Needs a repeat TSH today to reassess management. Prediabetes Last A1c elevated.  Patient is working on limiting simple carbs and being mindful of increasing total protein.  Repeat A1c and Insulin  level done today. Mixed hyperlipidemia Patient working on increasing protein and also keeping saturated fat under 20% of daily intake. Will repeat FLP today with goal to discuss labs at next appointment. Morbid obesity with BMI of 50.0-59.9, adult (HCC) - starting BMI 56.83 06/15/23  BMI 50.0-59.9, adult (HCC)    Diet: Cassandra Barrera is currently in the action stage of change. As such, her goal is to continue with weight loss efforts and has agreed to keeping a food journal and adhering to recommended goals of 1600-1700 calories and 120 or more grams of protein. Patient to start food log or journaling meal plan.  The initial goal will be to habitually log or journal for at least 4 days a week.  The expectation it that patient may not initially meet calorie or protein goals as the nturitional understanding of food intake is begun.  We discussed the 10:1 ratio when reading a food label.  Patient agrees to keep a food log either  electronically or on paper and bring to the next appointment to be able to dissect and discuss it with provider.    Exercise:  For substantial health benefits, adults should do at least 150 minutes (2 hours and 30 minutes) a week of moderate-intensity, or 75 minutes (1 hour and 15 minutes) a week of vigorous-intensity aerobic physical activity, or an equivalent combination of moderate- and vigorous-intensity aerobic activity. Aerobic activity should be performed in episodes of at least 10 minutes, and preferably, it should be spread throughout the week.  Behavior Modification:  We discussed the following Behavioral Modification Strategies today: increasing lean protein intake,  decreasing simple carbohydrates, increasing vegetables, and meal planning and cooking strategies.   Return in about 4 weeks (around 04/26/2024).   She was informed of the importance of frequent follow up visits to maximize her success with intensive lifestyle modifications for her multiple health conditions.  Attestation Statements:   Reviewed by clinician on day of visit: allergies, medications, problem list, medical history, surgical history, family history, social history, and previous encounter notes.     Cassandra Cho, MD

## 2024-03-30 LAB — COMPREHENSIVE METABOLIC PANEL WITH GFR
ALT: 20 IU/L (ref 0–32)
AST: 15 IU/L (ref 0–40)
Albumin: 4.5 g/dL (ref 3.8–4.9)
Alkaline Phosphatase: 84 IU/L (ref 44–121)
BUN/Creatinine Ratio: 47 — ABNORMAL HIGH (ref 9–23)
BUN: 21 mg/dL (ref 6–24)
Bilirubin Total: 0.4 mg/dL (ref 0.0–1.2)
CO2: 22 mmol/L (ref 20–29)
Calcium: 9.4 mg/dL (ref 8.7–10.2)
Chloride: 100 mmol/L (ref 96–106)
Creatinine, Ser: 0.45 mg/dL — ABNORMAL LOW (ref 0.57–1.00)
Globulin, Total: 2.8 g/dL (ref 1.5–4.5)
Glucose: 81 mg/dL (ref 70–99)
Potassium: 4.5 mmol/L (ref 3.5–5.2)
Sodium: 139 mmol/L (ref 134–144)
Total Protein: 7.3 g/dL (ref 6.0–8.5)
eGFR: 116 mL/min/1.73 (ref >59)

## 2024-03-30 LAB — INSULIN, RANDOM: INSULIN: 12.2 u[IU]/mL (ref 2.6–24.9)

## 2024-03-30 LAB — TSH: TSH: 4.42 u[IU]/mL (ref 0.450–4.500)

## 2024-03-30 LAB — LIPID PANEL WITH LDL/HDL RATIO
Cholesterol, Total: 187 mg/dL (ref 100–199)
HDL: 41 mg/dL (ref >39)
LDL Chol Calc (NIH): 113 mg/dL — ABNORMAL HIGH (ref 0–99)
LDL/HDL Ratio: 2.8 ratio (ref 0.0–3.2)
Triglycerides: 187 mg/dL — ABNORMAL HIGH (ref 0–149)
VLDL Cholesterol Cal: 33 mg/dL (ref 5–40)

## 2024-03-30 LAB — HEMOGLOBIN A1C
Est. average glucose Bld gHb Est-mCnc: 114 mg/dL
Hgb A1c MFr Bld: 5.6 % (ref 4.8–5.6)

## 2024-04-04 NOTE — Assessment & Plan Note (Signed)
 Previously elevated TSH in February of this year.  Repeat TSH in April normal.  Needs a repeat TSH today to reassess management.

## 2024-04-04 NOTE — Assessment & Plan Note (Signed)
 Last A1c elevated.  Patient is working on limiting simple carbs and being mindful of increasing total protein.  Repeat A1c and Insulin  level done today.

## 2024-04-06 ENCOUNTER — Ambulatory Visit (INDEPENDENT_AMBULATORY_CARE_PROVIDER_SITE_OTHER): Payer: 59 | Admitting: Family

## 2024-04-06 VITALS — BP 101/55 | HR 81 | Temp 98.6°F | Resp 16 | Ht 61.0 in | Wt 262.6 lb

## 2024-04-06 DIAGNOSIS — Z1231 Encounter for screening mammogram for malignant neoplasm of breast: Secondary | ICD-10-CM

## 2024-04-06 DIAGNOSIS — Z0000 Encounter for general adult medical examination without abnormal findings: Secondary | ICD-10-CM

## 2024-04-06 NOTE — Patient Instructions (Signed)
 VISIT SUMMARY:  Today, you had your annual physical exam. We discussed your occasional low blood pressure, menopausal symptoms, thyroid  function, and general health maintenance. Your recent lab results and health screenings were reviewed, and we planned your next steps for continued health monitoring.  YOUR PLAN:  MENOPAUSAL SYMPTOMS: You are experiencing irregular periods, hot flashes, and sleep disturbances, which are consistent with menopausal symptoms. -Monitor your symptoms and consider lifestyle changes to manage them. If symptoms worsen, we can discuss further treatment options.  HYPOTENSION: You have occasional low blood pressure readings, with recent measurements of 101/55 mmHg and 94/64 mmHg. This may be due to mild dehydration. -Increase your water intake to stay well-hydrated, especially in hot weather.  THYROID  FUNCTION MONITORING: Your recent TSH levels are normal, and no changes to your Synthroid  dosage are needed at this time. -Continue taking your current Synthroid  dosage as prescribed.  GENERAL HEALTH MAINTENANCE: We reviewed your routine health maintenance. Your mammogram is due soon, and your shingles and tetanus vaccinations are up to date. Your recent colonoscopy and Pap smear are current. -Schedule your mammogram soon. -Confirm that your hepatitis B vaccination is complete and remove it from your list if it is.  FOLLOW-UP: We discussed the importance of routine follow-up appointments. -Schedule your next follow-up appointment in six months.

## 2024-04-06 NOTE — Assessment & Plan Note (Addendum)
 Routine health maintenance discussed. Mammogram due soon. Shingles and tetanus vaccinations up to date. Hepatitis B vaccination likely completed. Healthy diet and regular exercise reported. Recent colonoscopy and Pap smear up to date. Vision and dental check-ups current. - Order mammogram. - Remove hepatitis B vaccination from list if she is confident it was completed. -Continue work on [employer], exercise and weight loss.

## 2024-04-06 NOTE — Progress Notes (Signed)
 Subjective:     Patient ID: Corean GORMAN Roll, female    DOB: 03/30/1971, 53 y.o.   MRN: [id number]  Chief Complaint  Patient presents with   Annual Exam    No concerns  Pt is fasting for labs    HPI  Discussed the use of AI scribe software for clinical note transcription with the patient, who gave verbal consent to proceed.  History of Present Illness   SANJNA HASKEW is a 53 year old female who presents for an annual physical exam.  She experiences occasional low blood pressure readings, with a recent measurement of 101/55 mmHg, lower than her usual 120/80 mmHg. She is not on any blood pressure medication.  She is on Synthroid  with no recent dosage changes and normal TSH levels. She takes sertraline  200 mg daily. Recent fasting labs show slightly high triglycerides but normal cholesterol and A1c levels.  She focuses on a healthier diet and exercises five to six days a week. She had a colonoscopy in January and a Pap smear in September 2024.  She experiences irregular periods, occasional hot flashes, and sleep disturbances, including waking up after a few hours and difficulty returning to sleep. No current cough, cold symptoms, skin concerns, hearing or vision issues, leg swelling, digestive concerns, constipation, diarrhea, or urinary issues.  Immunizations: Diet: diet is much better Exercise: 5-6 days weej  Colonoscopy: 1/25 Pap Smear: 9/24 negative Mammogram: due Vision: up to date Dental: up to date  BP Readings from Last 3 Encounters:  04/06/24 (!) 101/55  03/29/24 94/64  01/23/24 123/76       Health Maintenance Due  Topic Date Due   COVID-19 Vaccine (4 - 2024-25 season) 05/22/2023    Past Medical History:  Diagnosis Date   Anemia    Anxiety    Depression    Edema    Hyperlipidemia    Hypothyroidism    OSA on CPAP    Vitamin D  deficiency     Past Surgical History:  Procedure Laterality Date   COLONOSCOPY WITH PROPOFOL  N/A 09/26/2023    Procedure: COLONOSCOPY WITH PROPOFOL ;  Surgeon: San Sandor GAILS, DO;  Location: [facility];  Service: Gastroenterology;  Laterality: N/A;   POLYPECTOMY  09/26/2023   Procedure: POLYPECTOMY;  Surgeon: San Sandor GAILS, DO;  Location: [facility];  Service: Gastroenterology;;   WISDOM TOOTH EXTRACTION      Family History  Problem Relation Age of Onset   Hypertension Mother    Diabetes Mother    Heart disease Mother    Hyperlipidemia Mother    Kidney disease Mother    Anxiety disorder Mother    Depression Mother    Obesity Mother    Diabetes Father    Anxiety disorder Sister    Hypothyroidism Maternal Grandmother    Hypertension Maternal Grandmother    Heart disease Maternal Grandmother    Kidney failure Maternal Grandmother        solitary kidney   Bladder Cancer Maternal Grandfather        bladder- in his 80's   Diabetes Mellitus II Paternal Grandfather    CVA Paternal Grandfather    Anxiety disorder Daughter    Anxiety disorder Daughter     Social History   Socioeconomic History   Marital status: Married    Spouse name: Aloysius Leisure   Number of children: 2   Years of education: college   Highest education level: Bachelor's degree (e.g., BA, AB, BS)  Occupational History   Occupation: part-time  math tutor    Comment: High [employer]  Tobacco Use   Smoking status: Never   Smokeless tobacco: Never  Vaping Use   Vaping status: Never Used  Substance and Sexual Activity   Alcohol use: No    Alcohol/week: 0.0 standard drinks of alcohol   Drug use: No   Sexual activity: Yes    Birth control/protection: I.U.D.  Other Topics Concern   Not on file  Social History Narrative   Lives with her husband    2 daughters- one grad college, one in college   Her sister and her husband's family live in [place]   Professional math tutor at [employer]   Completed bachelor's degree   Enjoys reading, Clinical cytogeneticist, travel      Social Drivers of Research scientist (physical sciences) Strain: Low Risk  (04/05/2024)   Overall Financial Resource Strain (CARDIA)    Difficulty of Paying Living Expenses: Not hard at all  Food Insecurity: No Food Insecurity (04/05/2024)   Hunger Vital Sign    Worried About Running Out of Food in the Last Year: Never true    Ran Out of Food in the Last Year: Never true  Transportation Needs: No Transportation Needs (04/05/2024)   PRAPARE - Administrator, Civil Service (Medical): No    Lack of Transportation (Non-Medical): No  Physical Activity: Sufficiently Active (04/05/2024)   Exercise Vital Sign    Days of Exercise per Week: 5 days    Minutes of Exercise per Session: 30 min  Stress: No Stress Concern Present (04/05/2024)   [employer] of Occupational Health - Occupational Stress Questionnaire    Feeling of Stress: Not at all  Social Connections: Socially Isolated (04/05/2024)   Social Connection and Isolation Panel    Frequency of Communication with Friends and Family: Once a week    Frequency of Social Gatherings with Friends and Family: Once a week    Attends Religious Services: Never    Database administrator or Organizations: No    Attends Engineer, structural: Not on file    Marital Status: Married  Catering manager Violence: Not on file    Outpatient Medications Prior to Visit  Medication Sig Dispense Refill   Cholecalciferol (VITAMIN D3) 50 MCG (2000 UT) capsule Take 1 capsule (2,000 Units total) by mouth daily.     levothyroxine  (SYNTHROID ) 150 MCG tablet TAKE 1 TABLET(150 MCG) BY MOUTH DAILY 90 tablet 0   loratadine  (CLARITIN ) 10 MG tablet Take 1 tablet (10 mg total) by mouth daily. 30 tablet 11   Multiple Vitamins-Minerals (MULTIVITAMIN WITH MINERALS) tablet Take 1 tablet by mouth daily.     sertraline  (ZOLOFT ) 100 MG tablet TAKE 2 TABLETS BY MOUTH DAILY 180 tablet 1   No facility-administered medications prior to visit.    Allergies  Allergen Reactions   Sulfa Antibiotics Other (See  Comments)    Reaction: lightheaded    Review of Systems  Constitutional:  Negative for weight loss.  HENT:  Negative for congestion and hearing loss.   Eyes:  Negative for blurred vision.  Respiratory:  Negative for cough.   Cardiovascular:  Negative for leg swelling.  Gastrointestinal:  Negative for constipation and diarrhea.  Genitourinary:  Negative for dysuria and frequency.  Musculoskeletal:  Negative for joint pain and myalgias.  Skin:  Negative for rash.  Neurological:  Negative for headaches.  Psychiatric/Behavioral:  Negative for depression. The patient is not nervous/anxious.  Objective:    Physical Exam   BP (!) 101/55   Pulse 81   Temp 98.6 F (37 C) (Oral)   Resp 16   Ht 5' 1 (1.549 m)   Wt 262 lb 9.6 oz (119.1 kg)   LMP 02/29/2024 (Approximate)   SpO2 97%   BMI 49.62 kg/m  Wt Readings from Last 3 Encounters:  04/06/24 262 lb 9.6 oz (119.1 kg)  03/29/24 256 lb (116.1 kg)  01/23/24 256 lb (116.1 kg)   Physical Exam  Constitutional: She is oriented to person, place, and time. She appears well-developed and well-nourished. No distress.  HENT:  Head: Normocephalic and atraumatic.  Right Ear: Tympanic membrane and ear canal normal.  Left Ear: Tympanic membrane and ear canal normal.  Mouth/Throat: Oropharynx is clear and moist.  Eyes: Pupils are equal, round, and reactive to light. No scleral icterus.  Neck: Normal range of motion. No thyromegaly present.  Cardiovascular: Normal rate and regular rhythm.   No murmur heard. Pulmonary/Chest: Effort normal and breath sounds normal. No respiratory distress. He has no wheezes. She has no rales. She exhibits no tenderness.  Abdominal: Soft. Bowel sounds are normal. She exhibits no distension and no mass. There is no tenderness. There is no rebound and no guarding.  Musculoskeletal: She exhibits no edema.  Lymphadenopathy:    She has no cervical adenopathy.  Neurological: She is alert and oriented to  person, place, and time. She has normal patellar reflexes. She exhibits normal muscle tone. Coordination normal.  Skin: Skin is warm and dry.  Psychiatric: She has a normal mood and affect. Her behavior is normal. Judgment and thought content normal.  Breasts: Examined lying         Assessment & Plan:       Assessment & Plan:   Problem List Items Addressed This Visit       Unprioritized   Preventative health care   Routine health maintenance discussed. Mammogram due soon. Shingles and tetanus vaccinations up to date. Hepatitis B vaccination likely completed. Healthy diet and regular exercise reported. Recent colonoscopy and Pap smear up to date. Vision and dental check-ups current. - Order mammogram. - Remove hepatitis B vaccination from list if she is confident it was completed. -Continue work on [employer], exercise and weight loss.       Other Visit Diagnoses       Breast cancer screening by mammogram    -  Primary   Relevant Orders   MM 3D SCREENING MAMMOGRAM BILATERAL BREAST       I am having Corean RAMAN. Munroe maintain her loratadine , Vitamin D3, sertraline , multivitamin with minerals, and levothyroxine .  No orders of the defined types were placed in this encounter.

## 2024-04-19 ENCOUNTER — Encounter (HOSPITAL_BASED_OUTPATIENT_CLINIC_OR_DEPARTMENT_OTHER): Payer: Self-pay

## 2024-04-19 ENCOUNTER — Ambulatory Visit (HOSPITAL_BASED_OUTPATIENT_CLINIC_OR_DEPARTMENT_OTHER)
Admission: RE | Admit: 2024-04-19 | Discharge: 2024-04-19 | Disposition: A | Discharge status: Home / Self Care | Source: Ambulatory Visit | Attending: Family | Admitting: Family

## 2024-04-19 DIAGNOSIS — Z1231 Encounter for screening mammogram for malignant neoplasm of breast: Secondary | ICD-10-CM | POA: Insufficient documentation

## 2024-04-24 ENCOUNTER — Other Ambulatory Visit: Payer: Self-pay | Admitting: Family

## 2024-04-24 ENCOUNTER — Ambulatory Visit: Payer: Self-pay

## 2024-04-24 DIAGNOSIS — R928 Other abnormal and inconclusive findings on diagnostic imaging of breast: Secondary | ICD-10-CM

## 2024-04-26 ENCOUNTER — Encounter (INDEPENDENT_AMBULATORY_CARE_PROVIDER_SITE_OTHER): Payer: Self-pay | Admitting: Adult Health

## 2024-04-26 ENCOUNTER — Ambulatory Visit (INDEPENDENT_AMBULATORY_CARE_PROVIDER_SITE_OTHER): Admitting: Adult Health

## 2024-04-26 VITALS — BP 95/66 | HR 73 | Temp 98.0°F | Ht 60.0 in | Wt 262.0 lb

## 2024-04-26 DIAGNOSIS — E039 Hypothyroidism, unspecified: Secondary | ICD-10-CM | POA: Diagnosis not present

## 2024-04-26 DIAGNOSIS — F39 Unspecified mood [affective] disorder: Secondary | ICD-10-CM

## 2024-04-26 DIAGNOSIS — Z6841 Body Mass Index (BMI) 40.0 and over, adult: Secondary | ICD-10-CM

## 2024-04-26 DIAGNOSIS — E782 Mixed hyperlipidemia: Secondary | ICD-10-CM | POA: Diagnosis not present

## 2024-04-26 DIAGNOSIS — E559 Vitamin D deficiency, unspecified: Secondary | ICD-10-CM | POA: Diagnosis not present

## 2024-04-26 DIAGNOSIS — E669 Obesity, unspecified: Secondary | ICD-10-CM

## 2024-04-26 DIAGNOSIS — R7303 Prediabetes: Secondary | ICD-10-CM

## 2024-04-26 NOTE — Progress Notes (Signed)
 WEIGHT SUMMARY AND BIOMETRICS  Vitals Temp: 98 F (36.7 C) BP: 95/66 Pulse Rate: 73 SpO2: 96 %   Anthropometric Measurements Height: 5' (1.524 m) Weight: 262 lb (118.8 kg) BMI (Calculated): 51.17 Weight at Last Visit: 256 lb Weight Lost Since Last Visit: 0 Weight Gained Since Last Visit: 6 lb Starting Weight: 296 lb Total Weight Loss (lbs): 34 lb (15.4 kg) Peak Weight: 306 lb   Body Composition  Body Fat %: 51.1 % Fat Mass (lbs): 134 lbs Muscle Mass (lbs): 122 lbs Total Body Water (lbs): 94.2 lbs Visceral Fat Rating : 19   Other Clinical Data Fasting: no Labs: no Today's Visit #: 13 Starting Date: 06/15/23 Comments: [phone]    Chief Complaint:   OBESITY Cassandra Barrera is here to discuss her progress with her obesity treatment plan.  She is on the keeping a food journal and adhering to recommended goals of 1700 calories and 120g+ protein and states she is following her eating plan approximately 60 % of the time.  She states she is exercising [employer] with her husband 90 minutes 5 times per week.  Interim History:  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  Once the Fall 2025 semester starts, she will be able to return to consistently following journaling plan and regular exercise.  Exercise-  [employer] with her husband 90 minutes 5 times per week. Strength Training and Cardiovascular Exercise  She lives with her husband They have two adult daughters. Ages 60 and 20  Subjective:   1. Acquired hypothyroidism Discussed Labs  Latest Reference Range & Units 03/29/24 08:47  TSH 0.450 - 4.500 uIU/mL 4.420   TSH stable She is on morning Synthroid  She denies fatigue, weight gain, and sensitivity to cold  2. Prediabetes Discussed  Labs  Latest Reference Range & Units 03/29/24 08:47  Glucose 70 - 99 mg/dL 81  Hemoglobin J8R 4.8 - 5.6 % 5.6  Est. average glucose Bld gHb Est-mCnc mg/dL 885  INSULIN  2.6 - 24.9 uIU/mL 12.2    Latest Reference Range & Units 03/29/24 08:47  eGFR >59 mL/min/1.73 116   CBG, A1c both at goal Insulin  level improved, however still above goal She is on not any antidiabetic medications  3. Vitamin D  deficiency  Latest Reference Range & Units 09/20/23 08:41  Vitamin D , 25-Hydroxy 30.0 - 100.0 ng/mL 57.0   Vit D Level stable She is on daily OTC Vit D3 2,000 international units   4. Mixed hyperlipidemia Discussed Labs Lipid Panel     Component Value Date/Time   CHOL 187 03/29/2024 0847   TRIG 187 (H) 03/29/2024 0847   HDL 41 03/29/2024 0847   CHOLHDL 4.9 (H) 09/20/2023 0841   CHOLHDL 4 04/06/2023 1129   VLDL 61.0 (H) 04/06/2023 1129   LDLCALC 113 (H) 03/29/2024 0847   LDLDIRECT 110.0 04/06/2023 1129   LABVLDL 33 03/29/2024 0847   The 10-year ASCVD risk score (Arnett DK, et al., 2019) is: 1%   Values used to calculate the score:     Age: 53 years     Clincally relevant sex: Female     Is Non-Hispanic African American: No     Diabetic: No     Tobacco smoker: No     Systolic Blood Pressure: 95  mmHg     Is BP treated: No     HDL Cholesterol: 41 mg/dL     Total Cholesterol: 187 mg/dL   Assessment/Plan:   1. Acquired hypothyroidism Continue morning Synthroid  150mcg  2. Prediabetes (Primary) Resume Journaling MP and regular exercise  3. Vitamin D  deficiency Continue OTC supplementation and check labs Q2-3 M  4. Mixed hyperlipidemia Limit sat fat and increase regular cardiovascular exercise  6. BMI 50.0-59.9, adult (HCC), CURRENT BMI 51.2  [name] is not currently in the action stage of change. As such, her goal is to get back to weightloss efforts . She has agreed to keeping a food journal and adhering to recommended goals of 1700 calories and 120g protein.    Exercise goals: For substantial health benefits, adults should do at least 150 minutes (2 hours and 30 minutes) a week of moderate-intensity, or 75 minutes (1 hour and 15 minutes) a week of vigorous-intensity aerobic physical activity, or an equivalent combination of moderate- and vigorous-intensity aerobic activity. Aerobic activity should be performed in episodes of at least 10 minutes, and preferably, it should be spread throughout the week. [employer] with husband.  Behavioral modification strategies: increasing lean protein intake, decreasing simple carbohydrates, increasing vegetables, increasing water intake, decreasing eating out, no skipping meals, meal planning and cooking strategies, keeping healthy foods in the home, ways to avoid boredom eating, planning for success, keeping a strict food journal, and decreasing junk food.  Cassandra Barrera has agreed to follow-up with our clinic in 4 weeks. She was informed of the importance of frequent follow-up visits to maximize her success with intensive lifestyle modifications for her multiple health conditions.   Objective:   Blood pressure 95/66, pulse 73, temperature 98 F (36.7 C), height 5' (1.524 m), weight 262 lb (118.8 kg), last menstrual period 02/29/2024, SpO2 96%. Body mass index is 51.17 kg/m.  General: Cooperative, alert, well developed, in no acute distress. HEENT: Conjunctivae and lids unremarkable. Cardiovascular: Regular rhythm.  Lungs: Normal work of breathing. Neurologic: No focal deficits.   Lab Results  Component Value Date   CREATININE 0.45 (L) 03/29/2024   BUN 21 03/29/2024   NA 139 03/29/2024   K 4.5 03/29/2024   CL 100 03/29/2024   CO2 22 03/29/2024   Lab Results  Component Value Date   ALT 20 03/29/2024   AST 15 03/29/2024   ALKPHOS 84 03/29/2024   BILITOT 0.4 03/29/2024   Lab Results  Component Value Date   HGBA1C 5.6 03/29/2024   HGBA1C 5.7 (H) 09/20/2023   HGBA1C 6.0 04/06/2023   HGBA1C 5.5  09/11/2019   Lab Results  Component Value Date   INSULIN  12.2 03/29/2024   INSULIN  15.5 09/20/2023   INSULIN  14.1 06/15/2023   Lab Results  Component Value Date   TSH 4.420 03/29/2024   Lab Results  Component Value Date   CHOL 187 03/29/2024   HDL 41 03/29/2024   LDLCALC 113 (H) 03/29/2024   LDLDIRECT 110.0 04/06/2023   TRIG 187 (H) 03/29/2024   CHOLHDL 4.9 (H) 09/20/2023   Lab Results  Component Value Date   VD25OH 57.0 09/20/2023   VD25OH 24.30 (L) 04/06/2023   VD25OH 23.9 (L) 03/13/2019   Lab Results  Component Value Date   WBC 8.9 06/15/2023   HGB 13.7 06/15/2023   HCT 42.0 06/15/2023   MCV 91 06/15/2023   PLT 296 06/15/2023   No results found for: IRON, TIBC, FERRITIN  Attestation Statements:   Reviewed by clinician on day of visit:  allergies, medications, problem list, medical history, surgical history, family history, social history, and previous encounter notes.  Time spent on visit including pre-visit chart review and post-visit care and charting was 28 minutes.   I have reviewed the above documentation for accuracy and completeness, and I agree with the above. -  Gayle Collard d. Danea Manter, NP-C

## 2024-04-30 ENCOUNTER — Other Ambulatory Visit

## 2024-04-30 ENCOUNTER — Encounter

## 2024-05-01 ENCOUNTER — Ambulatory Visit
Admission: RE | Admit: 2024-05-01 | Discharge: 2024-05-01 | Disposition: A | Discharge status: Home / Self Care | Source: Ambulatory Visit | Attending: Family | Admitting: Family

## 2024-05-01 DIAGNOSIS — R928 Other abnormal and inconclusive findings on diagnostic imaging of breast: Secondary | ICD-10-CM

## 2024-05-24 ENCOUNTER — Other Ambulatory Visit: Payer: Self-pay | Admitting: Family

## 2024-06-06 ENCOUNTER — Ambulatory Visit (INDEPENDENT_AMBULATORY_CARE_PROVIDER_SITE_OTHER): Admitting: Adult Health

## 2024-07-04 ENCOUNTER — Ambulatory Visit (INDEPENDENT_AMBULATORY_CARE_PROVIDER_SITE_OTHER): Admitting: Adult Health

## 2024-07-09 ENCOUNTER — Ambulatory Visit (INDEPENDENT_AMBULATORY_CARE_PROVIDER_SITE_OTHER): Admitting: Adult Health

## 2024-08-21 ENCOUNTER — Other Ambulatory Visit: Payer: Self-pay

## 2024-08-21 DIAGNOSIS — F411 Generalized anxiety disorder: Secondary | ICD-10-CM

## 2024-08-21 DIAGNOSIS — F331 Major depressive disorder, recurrent, moderate: Secondary | ICD-10-CM

## 2024-08-21 MED ORDER — SERTRALINE HCL 100 MG PO TABS: 200.0000 mg | ORAL_TABLET | Freq: Every day | ORAL | 0 refills | Status: AC

## 2024-11-13 ENCOUNTER — Telehealth: Payer: 59 | Admitting: Family Medicine
